# Patient Record
Sex: Female | Born: 1970 | Race: Black or African American | Hispanic: No | Marital: Single | Smoking: Former smoker
Health system: Southern US, Community
[De-identification: ages and names within clinical notes are randomized; demographics above are authoritative.]

## PROBLEM LIST (undated history)

## (undated) DIAGNOSIS — I10 Essential (primary) hypertension: Secondary | ICD-10-CM

## (undated) DIAGNOSIS — O139 Gestational [pregnancy-induced] hypertension without significant proteinuria, unspecified trimester: Secondary | ICD-10-CM

## (undated) DIAGNOSIS — D509 Iron deficiency anemia, unspecified: Secondary | ICD-10-CM

## (undated) HISTORY — DX: Iron deficiency anemia, unspecified: D50.9

## (undated) HISTORY — DX: Essential (primary) hypertension: I10

---

## 2000-03-23 ENCOUNTER — Encounter: Payer: Self-pay | Admitting: Emergency Medicine

## 2000-03-23 ENCOUNTER — Emergency Department (HOSPITAL_COMMUNITY): Admission: EM | Admit: 2000-03-23 | Discharge: 2000-03-23 | Payer: Self-pay | Admitting: Emergency Medicine

## 2004-04-05 ENCOUNTER — Emergency Department (HOSPITAL_COMMUNITY): Admission: EM | Admit: 2004-04-05 | Discharge: 2004-04-05 | Payer: Self-pay | Admitting: Emergency Medicine

## 2011-06-21 ENCOUNTER — Ambulatory Visit (INDEPENDENT_AMBULATORY_CARE_PROVIDER_SITE_OTHER): Payer: BC Managed Care – PPO | Admitting: Emergency Medicine

## 2011-06-21 VITALS — BP 173/69 | HR 106 | Temp 98.8°F | Resp 20 | Ht 65.0 in | Wt 172.0 lb

## 2011-06-21 DIAGNOSIS — K047 Periapical abscess without sinus: Secondary | ICD-10-CM

## 2011-06-21 DIAGNOSIS — K0889 Other specified disorders of teeth and supporting structures: Secondary | ICD-10-CM

## 2011-06-21 DIAGNOSIS — K089 Disorder of teeth and supporting structures, unspecified: Secondary | ICD-10-CM

## 2011-06-21 MED ORDER — AMOXICILLIN 500 MG PO CAPS: 500.0000 mg | ORAL_CAPSULE | Freq: Three times a day (TID) | ORAL | Status: AC

## 2011-06-21 MED ORDER — HYDROCODONE-ACETAMINOPHEN 5-325 MG PO TABS: 1.0000 | ORAL_TABLET | Freq: Four times a day (QID) | ORAL | Status: AC | PRN

## 2011-06-21 NOTE — Patient Instructions (Signed)
Please call your oral surgeon today and schedule appointment to have dental extractions.Dental Caries  Tooth decay (dental caries, cavities) is the most common of all oral diseases. It occurs in all ages but is more common in children and young adults.  CAUSES  Bacteria in your mouth combine with foods (particularly sugars and starches) to produce plaque. Plaque is a substance that sticks to the hard surfaces of teeth. The bacteria in the plaque produce acids that attack the enamel of teeth. Repeated acid attacks dissolve the enamel and create holes in the teeth. Root surfaces of teeth may also get these holes.  Other contributing factors include:   Frequent snacking and drinking of cavity-producing foods and liquids.   Poor oral hygiene.   Dry mouth.   Substance abuse such as methamphetamine.   Broken or poor fitting dental restorations.   Eating disorders.   Gastroesophageal reflux disease (GERD).   Certain radiation treatments to the head and neck.  SYMPTOMS  At first, dental decay appears as white, chalky areas on the enamel. In this early stage, symptoms are seldom present. As the decay progresses, pits and holes may appear on the enamel surfaces. Progression of the decay will lead to softening of the hard layers of the tooth. At this point you may experience some pain or achy feeling after sweet, hot, or cold foods or drinks are consumed. If left untreated, the decay will reach the internal structures of the tooth and produce severe pain. Extensive dental treatment, such as root canal therapy, may be needed to save the tooth at this late stage of decay development.  DIAGNOSIS  Most cavities will be detected during regular check-ups. A thorough medical and dental history will be taken by the dentist. The dentist will use instruments to check the surfaces of your teeth for any breakdown or discoloration. Some dentists have special instruments, such as lasers, that detect tooth decay.  Dental X-rays may also show some cavities that are not visible to the eye (such as between the contact areas of the teeth). TREATMENT  Treatment involves removal of the tooth decay and replacement with a restorative material such as silver, gold, or composite (white) material. However, if the decay involves a large area of the tooth and there is little remaining healthy tooth structure, a cap (crown) will be fitted over the remaining structure. If the decay involves the center part of the tooth (pulp), root canal treatment will be needed before any type of dental restoration is placed. If the tooth is severely destroyed by the decay process, leaving the remaining tooth structures unrestorable, the tooth will need to be pulled (extracted). Some early tooth decay may be reversed by fluoride treatments and thorough brushing and flossing at home. PREVENTION   Eat healthy foods. Restrict the amount of sugary, starchy foods and liquids you consume. Avoid frequent snacking and drinking of unhealthy foods and liquids.   Sealants can help with prevention of cavities. Sealants are composite resins applied onto the biting surfaces of teeth at risk for decay. They smooth out the pits and grooves and prevent food from being trapped in them. This is done in early childhood before tooth decay has started.   Fluoride tablets may also be prescribed to children between 6 months and 4 years of age if your drinking water is not fluoridated. The fluoride absorbed by the tooth enamel makes teeth less susceptible to decay. Thorough daily cleaning with a toothbrush and dental floss is the best way to prevent  cavities. Use of a fluoride toothpaste is highly recommended. Fluoride mouth rinses may be used in specific cases.   Topical application of fluoride by your dentist is important in children.   Regular visits with a dentist for checkups and cleanings are also important.  SEEK IMMEDIATE DENTAL CARE IF:  You have a  fever.   You develop redness and swelling of your face, jaw, or neck.   You develop swelling around a tooth.   You are unable to open your mouth or cannot swallow.   You have severe pain uncontrolled by pain medicine.  Document Released: 10/31/2001 Document Revised: 01/28/2011 Document Reviewed: 07/16/2010 [facility] Patient Information 2012 [place], [place].

## 2011-06-21 NOTE — Progress Notes (Signed)
  Subjective:    Patient ID: Sonya Taylor, female    DOB: Oct 20, 1970, 41 y.o.   MRN: [id number]  HPI patient presents with onset yesterday of swelling and tenderness in the right lower jaw. She has a history of dental care he is in that area and has had trouble before.    Review of Systems noncontributory except for heavy menstrual cramps with     Objective:   Physical Exam physical exam reveals the lower first and second premolars are broken off at the dump swelling around the dental caries        Assessment & Plan:  Patient here with significant dental caries dental abscess and gum swelling. We'll treat with amoxicillin pain medications and ibuprofen.

## 2012-02-20 ENCOUNTER — Encounter (HOSPITAL_BASED_OUTPATIENT_CLINIC_OR_DEPARTMENT_OTHER): Payer: Self-pay | Admitting: *Deleted

## 2012-02-20 ENCOUNTER — Emergency Department (HOSPITAL_BASED_OUTPATIENT_CLINIC_OR_DEPARTMENT_OTHER): Payer: BC Managed Care – PPO

## 2012-02-20 ENCOUNTER — Inpatient Hospital Stay (HOSPITAL_BASED_OUTPATIENT_CLINIC_OR_DEPARTMENT_OTHER)
Admission: EM | Admit: 2012-02-20 | Discharge: 2012-02-22 | DRG: 395 | Disposition: A | Discharge status: Home / Self Care | Payer: BC Managed Care – PPO | Attending: Internal Medicine | Admitting: Internal Medicine

## 2012-02-20 DIAGNOSIS — R609 Edema, unspecified: Secondary | ICD-10-CM

## 2012-02-20 DIAGNOSIS — R600 Localized edema: Secondary | ICD-10-CM

## 2012-02-20 DIAGNOSIS — E669 Obesity, unspecified: Secondary | ICD-10-CM | POA: Diagnosis present

## 2012-02-20 DIAGNOSIS — N924 Excessive bleeding in the premenopausal period: Secondary | ICD-10-CM

## 2012-02-20 DIAGNOSIS — D696 Thrombocytopenia, unspecified: Secondary | ICD-10-CM | POA: Diagnosis present

## 2012-02-20 DIAGNOSIS — F5083 Pica in adults: Secondary | ICD-10-CM | POA: Diagnosis present

## 2012-02-20 DIAGNOSIS — E876 Hypokalemia: Secondary | ICD-10-CM | POA: Diagnosis present

## 2012-02-20 DIAGNOSIS — D649 Anemia, unspecified: Secondary | ICD-10-CM

## 2012-02-20 DIAGNOSIS — F5089 Other specified eating disorder: Secondary | ICD-10-CM | POA: Diagnosis present

## 2012-02-20 DIAGNOSIS — D509 Iron deficiency anemia, unspecified: Secondary | ICD-10-CM | POA: Diagnosis present

## 2012-02-20 DIAGNOSIS — I509 Heart failure, unspecified: Secondary | ICD-10-CM | POA: Diagnosis present

## 2012-02-20 DIAGNOSIS — D500 Iron deficiency anemia secondary to blood loss (chronic): Principal | ICD-10-CM | POA: Diagnosis present

## 2012-02-20 DIAGNOSIS — R011 Cardiac murmur, unspecified: Secondary | ICD-10-CM | POA: Diagnosis present

## 2012-02-20 DIAGNOSIS — I10 Essential (primary) hypertension: Secondary | ICD-10-CM | POA: Diagnosis present

## 2012-02-20 DIAGNOSIS — Z6829 Body mass index (BMI) 29.0-29.9, adult: Secondary | ICD-10-CM

## 2012-02-20 DIAGNOSIS — N920 Excessive and frequent menstruation with regular cycle: Secondary | ICD-10-CM | POA: Diagnosis present

## 2012-02-20 HISTORY — DX: Gestational (pregnancy-induced) hypertension without significant proteinuria, unspecified trimester: O13.9

## 2012-02-20 LAB — BASIC METABOLIC PANEL
BUN: 6 mg/dL (ref 6–23)
CO2: 24 mEq/L (ref 19–32)
Chloride: 106 mEq/L (ref 96–112)
Creatinine, Ser: 0.6 mg/dL (ref 0.50–1.10)
GFR calc Af Amer: >90 mL/min (ref >90)
Glucose, Bld: 98 mg/dL (ref 70–99)
Potassium: 3.7 mEq/L (ref 3.5–5.1)

## 2012-02-20 LAB — URINALYSIS, ROUTINE W REFLEX MICROSCOPIC
Bilirubin Urine: NEGATIVE
Glucose, UA: NEGATIVE mg/dL
Hgb urine dipstick: NEGATIVE
Ketones, ur: NEGATIVE mg/dL
Nitrite: NEGATIVE
Protein, ur: NEGATIVE mg/dL
Specific Gravity, Urine: 1.013 (ref 1.005–1.030)
Urobilinogen, UA: 0.2 mg/dL (ref 0.0–1.0)
pH: 6 (ref 5.0–8.0)

## 2012-02-20 LAB — URINE MICROSCOPIC-ADD ON

## 2012-02-20 LAB — CBC
Hemoglobin: 3.5 g/dL — CL (ref 12.0–15.0)
MCH: 17.8 pg — ABNORMAL LOW (ref 26.0–34.0)
Platelets: 64 10*3/uL — ABNORMAL LOW (ref 150–400)
RBC: 1.97 MIL/uL — ABNORMAL LOW (ref 3.87–5.11)
WBC: 5.4 10*3/uL (ref 4.0–10.5)

## 2012-02-20 LAB — RETICULOCYTES: Retic Count, Absolute: 23.9 10*3/uL (ref 19.0–186.0)

## 2012-02-20 LAB — TROPONIN I: Troponin I: <0.3 ng/mL (ref <0.30)

## 2012-02-20 LAB — PREGNANCY, URINE: Preg Test, Ur: NEGATIVE

## 2012-02-20 MED ORDER — SODIUM CHLORIDE 0.9 % IV SOLN
Freq: Once | INTRAVENOUS | Status: AC
  Administered 2012-02-20: 21:00:00 via INTRAVENOUS

## 2012-02-20 MED ORDER — FUROSEMIDE 10 MG/ML IJ SOLN
40.0000 mg | Freq: Once | INTRAMUSCULAR | Status: AC
  Administered 2012-02-20: 40 mg via INTRAVENOUS
  Filled 2012-02-20: qty 4

## 2012-02-20 NOTE — ED Notes (Signed)
[facility] arrived. Report given to [facility].

## 2012-02-20 NOTE — ED Notes (Signed)
Pt states her legs have been swelling since before Christmas. No hx of same. States the swelling comes and goes.

## 2012-02-20 NOTE — ED Notes (Signed)
Attempt to call report to 2900. Secretary will have RN call for report.

## 2012-02-20 NOTE — ED Notes (Signed)
MD at bedside. 

## 2012-02-20 NOTE — ED Notes (Signed)
Pt. Transported to [facility]. Pt stable at d/c. No complaints. Sinus on tele.

## 2012-02-20 NOTE — ED Notes (Signed)
MD aware of low Hgb. Pt. Placed on monitor. Will monitor. Family aware that pt is being admitted to [facility].

## 2012-02-20 NOTE — ED Provider Notes (Signed)
History  This chart was scribed for Lyanne Co, MD by Ardeen Jourdain, ED Scribe. This patient was seen in room MH12/MH12 and the patient's care was started at 1931.  CSN: [id number]  Arrival date & time 02/20/12  1842   First MD Initiated Contact with Patient 02/20/12 1931      Chief Complaint  Patient presents with  . Leg Swelling     The history is provided by the patient. No language interpreter was used.    Sonya Taylor is a 41 y.o. female who presents to the Emergency Department complaining of gradually worsening leg swelling. She reports the swelling is aggravated by standing and walking. She states the swelling is relieved normally by rest and lying down. She denies problems with urination, CP and SOB as associated symptoms. 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 She denies any h/o HTN, DM and CHF. She denies any family history of    Past Medical History  Diagnosis Date  . PIH (pregnancy induced hypertension)     Past Surgical History  Procedure Date  . Cesarean section     History reviewed. No pertinent family history.  History  Substance Use Topics  . Smoking status: Current Every Day Smoker  . Smokeless tobacco: Not on file  . Alcohol Use: No   No OB history available.  Review of Systems  A complete 10 system review of systems was obtained and all systems are negative except as noted in the HPI and PMH.   Allergies  Review of patient's allergies indicates no known allergies.  Home Medications  No current outpatient prescriptions on file.  Triage Vitals: BP 161/90  Pulse 102  Temp 98.4 F (36.9 C) (Oral)  Resp 20  Ht 5' 6.5" (1.689 m)  Wt 180 lb (81.647 kg)  BMI 28.62 kg/m2  SpO2 100%  LMP 01/21/2012  Physical Exam  Nursing note and vitals reviewed. Constitutional: She is oriented to person, place, and time. She appears well-developed and well-nourished. No  distress.  HENT:  Head: Normocephalic and atraumatic.  Eyes: EOM are normal.  Neck: Normal range of motion.  Cardiovascular: Normal rate, regular rhythm and normal heart sounds.   Pulmonary/Chest: Effort normal and breath sounds normal. No respiratory distress. She has no wheezes.  Abdominal: Soft. She exhibits no distension. There is no tenderness.  Musculoskeletal: Normal range of motion. She exhibits edema.       2+ symmetrical pitting edema bilaterally   Neurological: She is alert and oriented to person, place, and time.  Skin: Skin is warm and dry.  Psychiatric: She has a normal mood and affect. Judgment normal.    ED Course  Procedures (including critical care time)  CRITICAL CARE Performed by: Lyanne Co Total critical care time: 30 Critical care time was exclusive of separately billable procedures and treating other patients. Critical care was necessary to treat or prevent imminent or life-threatening deterioration. Critical care was time spent personally by me on the following activities: development of treatment plan with patient and/or surrogate as well as nursing, discussions with consultants, evaluation of patient's response to treatment, examination of patient, obtaining history from patient or surrogate, ordering and performing treatments and interventions, ordering and review of laboratory studies, ordering and review of radiographic studies, pulse oximetry and re-evaluation of patient's condition.   DIAGNOSTIC STUDIES: Oxygen Saturation is 100% on room air, normal by my interpretation.  COORDINATION OF CARE:  7:42 PM: Discussed treatment plan which includes blood work, urinalysis and CXR with pt at bedside and pt agreed to plan.    Labs Reviewed  URINALYSIS, ROUTINE W REFLEX MICROSCOPIC - Abnormal; Notable for the following:    Leukocytes, UA MODERATE (*)     All other components within normal limits  PRO B NATRIURETIC PEPTIDE - Abnormal; Notable for the  following:    Pro B Natriuretic peptide (BNP) 1019.0 (*)     All other components within normal limits  D-DIMER, QUANTITATIVE - Abnormal; Notable for the following:    D-Dimer, Quant 0.75 (*)     All other components within normal limits  CBC - Abnormal; Notable for the following:    RBC 1.97 (*)     Hemoglobin 3.5 (*)     HCT 12.8 (*)     MCV 65.0 (*)     MCH 17.8 (*)     MCHC 27.3 (*)     RDW 31.6 (*)     Platelets 64 (*)  PLATELET COUNT CONFIRMED BY SMEAR   All other components within normal limits  PREGNANCY, URINE  BASIC METABOLIC PANEL  URINE MICROSCOPIC-ADD ON  TROPONIN I  VITAMIN B12  FOLATE  IRON AND TIBC  FERRITIN  RETICULOCYTES   Dg Chest 2 View  02/20/2012  *RADIOLOGY REPORT*  Clinical Data: Bilateral leg swelling.  CHEST - 2 VIEW  Comparison: None.  Findings: Trachea is midline.  Heart is mildly enlarged.  Mild diffuse interstitial prominence and indistinctness with peripheral septal lines at the lung bases. Probable trace bilateral pleural effusions with thickening of the fissures on the lateral view.  IMPRESSION: Mild congestive heart failure.   Original Report Authenticated By: Leanna Battles, M.D.    I personally reviewed the imaging tests through PACS system I reviewed available ER/hospitalization records through the EMR    1. CHF (congestive heart failure)   2. Anemia   3. Thrombocytopenia       MDM  The patient appears to be presenting with new onset congestive heart failure likely secondary to severe anemia.  Some clear with the etiology of her anemia is coming.  At this time I do not think she needs oh negative blood as her vital signs are stable and this is likely more bright acute or chronic issue.  She will need type and screen and blood transfusion issues she shows up to the [facility].  I discussed her care with the hospitalist who agrees to accept the patient in transfer.  The patient be admitted to the step down unit.  She did receive  40 mg of IV Lasix in emergency department.  Her EKG demonstrates nonspecific ST and T wave changes.  She has no active chest pain at this time.  She does not have heavy menstrual periods.  She denies melena or hematochezia.   Date: 02/20/2012  Rate: 89  Rhythm: normal sinus rhythm  QRS Axis: normal  Intervals: normal  ST/T Wave abnormalities: normal  Conduction Disutrbances: none  Narrative Interpretation:   Old EKG Reviewed: no prior ecg         I personally performed the services described in this documentation, which was scribed in my presence. The recorded information has been reviewed and is accurate.      Lyanne Co, MD 02/20/12 2232

## 2012-02-20 NOTE — Progress Notes (Signed)
Per Signout: 41 yo F with new onset CHF, labs remarkable for HGB of 3.5!! Tested twice at [facility], is a real value not dilutional.  Transferring to [facility] for admission and blood transfusion(s).  Also has low platelets.  Chronic bleed vs bone marrow, no acute obvious GI bleed.

## 2012-02-20 NOTE — ED Notes (Signed)
Transported to xray 

## 2012-02-20 NOTE — ED Notes (Signed)
Report to 2900.

## 2012-02-21 DIAGNOSIS — I509 Heart failure, unspecified: Secondary | ICD-10-CM | POA: Diagnosis present

## 2012-02-21 DIAGNOSIS — R030 Elevated blood-pressure reading, without diagnosis of hypertension: Secondary | ICD-10-CM

## 2012-02-21 DIAGNOSIS — E876 Hypokalemia: Secondary | ICD-10-CM | POA: Diagnosis present

## 2012-02-21 DIAGNOSIS — R011 Cardiac murmur, unspecified: Secondary | ICD-10-CM | POA: Diagnosis present

## 2012-02-21 DIAGNOSIS — I10 Essential (primary) hypertension: Secondary | ICD-10-CM | POA: Diagnosis present

## 2012-02-21 DIAGNOSIS — F5089 Other specified eating disorder: Secondary | ICD-10-CM | POA: Diagnosis present

## 2012-02-21 DIAGNOSIS — N924 Excessive bleeding in the premenopausal period: Secondary | ICD-10-CM | POA: Diagnosis present

## 2012-02-21 DIAGNOSIS — R609 Edema, unspecified: Secondary | ICD-10-CM

## 2012-02-21 DIAGNOSIS — D529 Folate deficiency anemia, unspecified: Secondary | ICD-10-CM

## 2012-02-21 DIAGNOSIS — R600 Localized edema: Secondary | ICD-10-CM | POA: Diagnosis present

## 2012-02-21 DIAGNOSIS — D509 Iron deficiency anemia, unspecified: Secondary | ICD-10-CM | POA: Diagnosis present

## 2012-02-21 DIAGNOSIS — D696 Thrombocytopenia, unspecified: Secondary | ICD-10-CM | POA: Diagnosis present

## 2012-02-21 LAB — COMPREHENSIVE METABOLIC PANEL
ALT: 12 U/L (ref 0–35)
AST: 18 U/L (ref 0–37)
Calcium: 9.1 mg/dL (ref 8.4–10.5)
Creatinine, Ser: 0.55 mg/dL (ref 0.50–1.10)
GFR calc Af Amer: >90 mL/min (ref >90)
GFR calc non Af Amer: >90 mL/min (ref >90)
Glucose, Bld: 101 mg/dL — ABNORMAL HIGH (ref 70–99)
Sodium: 142 mEq/L (ref 135–145)
Total Protein: 6.2 g/dL (ref 6.0–8.3)

## 2012-02-21 LAB — BASIC METABOLIC PANEL
BUN: 6 mg/dL (ref 6–23)
CO2: 25 mEq/L (ref 19–32)
Calcium: 8.5 mg/dL (ref 8.4–10.5)
Chloride: 104 mEq/L (ref 96–112)
Creatinine, Ser: 0.56 mg/dL (ref 0.50–1.10)
Creatinine, Ser: 0.53 mg/dL (ref 0.50–1.10)
GFR calc Af Amer: >90 mL/min (ref >90)
GFR calc non Af Amer: >90 mL/min (ref >90)
Glucose, Bld: 119 mg/dL — ABNORMAL HIGH (ref 70–99)
Glucose, Bld: 102 mg/dL — ABNORMAL HIGH (ref 70–99)
Sodium: 139 mEq/L (ref 135–145)

## 2012-02-21 LAB — CBC
HCT: 19 % — ABNORMAL LOW (ref 36.0–46.0)
Hemoglobin: 5.9 g/dL — CL (ref 12.0–15.0)
MCH: 17.3 pg — ABNORMAL LOW (ref 26.0–34.0)
MCHC: 31.1 g/dL (ref 30.0–36.0)
MCV: 64.4 fL — ABNORMAL LOW (ref 78.0–100.0)
Platelets: 51 10*3/uL — ABNORMAL LOW (ref 150–400)
RBC: 2.7 MIL/uL — ABNORMAL LOW (ref 3.87–5.11)
RDW: 31.6 % — ABNORMAL HIGH (ref 11.5–15.5)
WBC: 4.7 10*3/uL (ref 4.0–10.5)

## 2012-02-21 LAB — MRSA PCR SCREENING: MRSA by PCR: NEGATIVE

## 2012-02-21 LAB — IRON AND TIBC
Iron: 30 ug/dL — ABNORMAL LOW (ref 42–135)
TIBC: 559 ug/dL — ABNORMAL HIGH (ref 250–470)

## 2012-02-21 LAB — HEPATIC FUNCTION PANEL
Albumin: 3.2 g/dL — ABNORMAL LOW (ref 3.5–5.2)
Alkaline Phosphatase: 95 U/L (ref 39–117)
Indirect Bilirubin: 0.3 mg/dL (ref 0.3–0.9)
Total Protein: 6.2 g/dL (ref 6.0–8.3)

## 2012-02-21 LAB — LACTATE DEHYDROGENASE: LDH: 178 U/L (ref 94–250)

## 2012-02-21 LAB — DIRECT ANTIGLOBULIN TEST (NOT AT ARMC): DAT, IgG: NEGATIVE

## 2012-02-21 LAB — FERRITIN: Ferritin: 9 ng/mL — ABNORMAL LOW (ref 10–291)

## 2012-02-21 LAB — PREPARE RBC (CROSSMATCH)

## 2012-02-21 LAB — VITAMIN B12: Vitamin B-12: 686 pg/mL (ref 211–911)

## 2012-02-21 LAB — ABO/RH: ABO/RH(D): A POS

## 2012-02-21 MED ORDER — OXYCODONE HCL 5 MG PO TABS: 5.0000 mg | ORAL_TABLET | ORAL | Status: DC | PRN

## 2012-02-21 MED ORDER — SODIUM CHLORIDE 0.9 % IV SOLN: 250.0000 mL | INTRAVENOUS | Status: DC | PRN

## 2012-02-21 MED ORDER — ONDANSETRON HCL 4 MG/2ML IJ SOLN: 4.0000 mg | Freq: Four times a day (QID) | INTRAMUSCULAR | Status: DC | PRN

## 2012-02-21 MED ORDER — FUROSEMIDE 10 MG/ML IJ SOLN
40.0000 mg | Freq: Two times a day (BID) | INTRAMUSCULAR | Status: DC
  Filled 2012-02-21: qty 4

## 2012-02-21 MED ORDER — SODIUM CHLORIDE 0.9 % IJ SOLN
3.0000 mL | Freq: Two times a day (BID) | INTRAMUSCULAR | Status: DC
  Administered 2012-02-21: 3 mL via INTRAVENOUS

## 2012-02-21 MED ORDER — HYDROCHLOROTHIAZIDE 12.5 MG PO CAPS
12.5000 mg | ORAL_CAPSULE | Freq: Every day | ORAL | Status: DC
  Administered 2012-02-21 – 2012-02-22 (×2): 12.5 mg via ORAL
  Filled 2012-02-21 (×2): qty 1

## 2012-02-21 MED ORDER — CARVEDILOL 3.125 MG PO TABS
3.1250 mg | ORAL_TABLET | Freq: Two times a day (BID) | ORAL | Status: DC
  Filled 2012-02-21 (×3): qty 1

## 2012-02-21 MED ORDER — ONDANSETRON HCL 4 MG PO TABS: 4.0000 mg | ORAL_TABLET | Freq: Four times a day (QID) | ORAL | Status: DC | PRN

## 2012-02-21 MED ORDER — ACETAMINOPHEN 325 MG PO TABS: 650.0000 mg | ORAL_TABLET | ORAL | Status: DC | PRN

## 2012-02-21 MED ORDER — SODIUM CHLORIDE 0.9 % IV SOLN
25.0000 mg | Freq: Once | INTRAVENOUS | Status: AC
  Administered 2012-02-21: 25 mg via INTRAVENOUS
  Filled 2012-02-21: qty 0.5

## 2012-02-21 MED ORDER — DIPHENHYDRAMINE HCL 50 MG/ML IJ SOLN: 25.0000 mg | Freq: Once | INTRAMUSCULAR | Status: DC

## 2012-02-21 MED ORDER — SODIUM CHLORIDE 0.9 % IJ SOLN: 3.0000 mL | INTRAMUSCULAR | Status: DC | PRN

## 2012-02-21 MED ORDER — CARVEDILOL 6.25 MG PO TABS
6.2500 mg | ORAL_TABLET | Freq: Two times a day (BID) | ORAL | Status: DC
  Administered 2012-02-21 – 2012-02-22 (×3): 6.25 mg via ORAL
  Filled 2012-02-21 (×4): qty 1

## 2012-02-21 MED ORDER — POTASSIUM CHLORIDE CRYS ER 20 MEQ PO TBCR: 20.0000 meq | EXTENDED_RELEASE_TABLET | Freq: Two times a day (BID) | ORAL | Status: DC

## 2012-02-21 MED ORDER — SODIUM CHLORIDE 0.9 % IV SOLN
2000.0000 mg | Freq: Once | INTRAVENOUS | Status: DC
  Filled 2012-02-21: qty 40

## 2012-02-21 MED ORDER — FUROSEMIDE 10 MG/ML IJ SOLN
20.0000 mg | Freq: Once | INTRAMUSCULAR | Status: AC
  Administered 2012-02-21: 20 mg via INTRAVENOUS

## 2012-02-21 MED ORDER — FOLIC ACID 1 MG PO TABS
2.0000 mg | ORAL_TABLET | Freq: Every day | ORAL | Status: DC
  Administered 2012-02-21 – 2012-02-22 (×2): 2 mg via ORAL
  Filled 2012-02-21 (×2): qty 2

## 2012-02-21 MED ORDER — ZOLPIDEM TARTRATE 5 MG PO TABS: 5.0000 mg | ORAL_TABLET | Freq: Every evening | ORAL | Status: DC | PRN

## 2012-02-21 MED ORDER — LISINOPRIL 2.5 MG PO TABS
2.5000 mg | ORAL_TABLET | Freq: Every day | ORAL | Status: DC
  Filled 2012-02-21: qty 1

## 2012-02-21 MED ORDER — ALUM & MAG HYDROXIDE-SIMETH 200-200-20 MG/5ML PO SUSP: 30.0000 mL | Freq: Four times a day (QID) | ORAL | Status: DC | PRN

## 2012-02-21 MED ORDER — ACETAMINOPHEN 325 MG PO TABS
650.0000 mg | ORAL_TABLET | Freq: Four times a day (QID) | ORAL | Status: DC | PRN
  Administered 2012-02-22 (×3): 650 mg via ORAL
  Filled 2012-02-21: qty 1
  Filled 2012-02-21 (×5): qty 2

## 2012-02-21 MED ORDER — ACETAMINOPHEN 325 MG PO TABS
325.0000 mg | ORAL_TABLET | ORAL | Status: AC
  Administered 2012-02-21 (×3): 325 mg via ORAL
  Filled 2012-02-21: qty 1
  Filled 2012-02-21: qty 2

## 2012-02-21 MED ORDER — DIPHENHYDRAMINE HCL 25 MG PO CAPS
25.0000 mg | ORAL_CAPSULE | ORAL | Status: AC
  Administered 2012-02-21 (×3): 25 mg via ORAL
  Filled 2012-02-21 (×3): qty 1

## 2012-02-21 MED ORDER — HYDRALAZINE HCL 20 MG/ML IJ SOLN
10.0000 mg | Freq: Four times a day (QID) | INTRAMUSCULAR | Status: DC | PRN
  Filled 2012-02-21: qty 1

## 2012-02-21 MED ORDER — POTASSIUM CHLORIDE CRYS ER 20 MEQ PO TBCR
40.0000 meq | EXTENDED_RELEASE_TABLET | Freq: Three times a day (TID) | ORAL | Status: AC
  Administered 2012-02-21 (×3): 40 meq via ORAL
  Filled 2012-02-21 (×3): qty 2

## 2012-02-21 MED ORDER — SODIUM CHLORIDE 0.9 % IV SOLN
25.0000 mg | Freq: Once | INTRAVENOUS | Status: DC
  Filled 2012-02-21: qty 0.5

## 2012-02-21 MED ORDER — HYDRALAZINE HCL 10 MG PO TABS
10.0000 mg | ORAL_TABLET | Freq: Four times a day (QID) | ORAL | Status: DC
  Administered 2012-02-21 – 2012-02-22 (×2): 10 mg via ORAL
  Filled 2012-02-21 (×7): qty 1

## 2012-02-21 MED ORDER — SODIUM CHLORIDE 0.9 % IV SOLN: 2000.0000 mg | Freq: Once | INTRAVENOUS | Status: DC

## 2012-02-21 MED ORDER — ACETAMINOPHEN 650 MG RE SUPP: 650.0000 mg | Freq: Four times a day (QID) | RECTAL | Status: DC | PRN

## 2012-02-21 MED ORDER — HYDROMORPHONE HCL PF 1 MG/ML IJ SOLN: 0.5000 mg | INTRAMUSCULAR | Status: DC | PRN

## 2012-02-21 NOTE — Progress Notes (Signed)
Dr. Lovell Sheehan currently in room with pt.

## 2012-02-21 NOTE — H&P (Signed)
[facility] History and Physical  SHERRITA RIEDERER WUJ:[id number] DOB: November 03, 1970 DOA: 02/20/2012  Referring physician: EDP PCP: Tally Due, MD  Specialists:   Chief Complaint: Increased Swelling in Both legs   HPI: Sonya Taylor is a 41 y.o. female who presented to the [facility] ED with complaints of Increased Swelling in both legs noticed upon awakening.  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  This time when she awakened the swelling was worse than before she went to sleep.    She denies having any Chest Pain, SOB or Orthopnea, or dyspnea on exertion.   She reports having hypertension when she was pregnant, and she reports she had a heart murmur when she was a child, and she states that she was told that it went away.      In the ED, she was [place]NP [place] 9[address], and a hemoglobin level of 3.5.   She denies having any hematemesis, melena, or hematochezia.  She does report having menorrhagia and a menses that is regular and lasts 7 days.   She also report that she chronically eats and craves ice.   She was also found to have a loud III/VI SEM on examination as well as 3+BLE Edema.      Review of Systems: The patient denies anorexia, fever, weight loss, vision loss, decreased hearing, hoarseness, chest pain, syncope, dyspnea on exertion, balance deficits, hemoptysis, abdominal pain, nausea, vomiting, diarrhea, hematemesis, melena, hematochezia, severe indigestion/heartburn, hematuria, dysuria, incontinence, genital sores, muscle weakness, suspicious skin lesions, transient blindness, difficulty walking, depression, unusual weight change, abnormal bleeding, enlarged lymph nodes, angioedema, and breast masses.    Past Medical History  Diagnosis Date  . PIH (pregnancy induced hypertension)    Past  Surgical History  Procedure Date  . Cesarean section      Medications:  HOME MEDS: Prior to Admission medications   Not on File    Allergies:  No Known Allergies  Social History:   reports that she has been smoking.  She does not have any smokeless tobacco history on file. She reports that she does not drink alcohol or use illicit drugs.  Family History: .  Hypertension in Maternal Aunt    Diabetes in Daughter   Physical Exam:  GEN:  Pleasant 41 year old Obese African American Female examined  and in no acute distress; cooperative with exam Filed Vitals:   02/21/12 0015 02/21/12 0030 02/21/12 0045 02/21/12 0100  BP: 161/79 160/79 141/77 137/83  Pulse:      Temp:      TempSrc:      Resp: 22 15 14 15   Height:      Weight:      SpO2:  98%     Blood pressure 137/83, pulse 90, temperature 99 F (37.2 C), temperature source Oral, resp. rate 15, height 5' 6.5" (1.689 m), weight 81.6 kg (179 lb 14.3 oz), last menstrual period 01/21/2012, SpO2 98.00%. PSYCH: She is alert and oriented x4; does not appear anxious does not appear depressed; affect is normal HEENT: Normocephalic and Atraumatic, Mucous membranes pink; PERRLA; EOM intact; Fundi:  Benign;  No scleral icterus, Nares: Patent, Oropharynx: Clear, Poor Dentition, Neck:  FROM, no cervical lymphadenopathy nor thyromegaly or carotid bruit; no JVD; Beasts:: Not examined CHEST WALL: No tenderness CHEST: Normal respiration,  clear to auscultation bilaterally HEART: Regular rate and rhythm; no murmurs rubs or gallops BACK: No kyphosis or scoliosis; no CVA tenderness ABDOMEN: Positive Bowel Sounds, Obese, soft non-tender; no masses, no organomegaly, no pannus; no intertriginous candida. Rectal Exam: Not done EXTREMITIES: No bone or joint deformity; age-appropriate arthropathy of the hands and knees; no cyanosis, clubbing or ulcerations.  3+ BLE EDEMA.   Genitalia: not examined PULSES: 2+ and symmetric SKIN: Normal hydration no  rash or ulceration CNS: Cranial nerves 2-12 grossly intact no focal neurologic deficit    Labs on Admission:  Basic Metabolic Panel:  Lab 02/20/12 1610  NA 140  K 3.7  CL 106  CO2 24  GLUCOSE 98  BUN 6  CREATININE 0.60  CALCIUM 8.6  MG --  PHOS --   Liver Function Tests: No results found for this basename: AST:5,ALT:5,ALKPHOS:5,BILITOT:5,PROT:5,ALBUMIN:5 in the last 168 hours No results found for this basename: LIPASE:5,AMYLASE:5 in the last 168 hours No results found for this basename: AMMONIA:5 in the last 168 hours CBC:  Lab 02/20/12 2020  WBC 5.4  NEUTROABS --  HGB 3.5*  HCT 12.8*  MCV 65.0*  PLT 64*   Cardiac Enzymes:  Lab 02/20/12 2000  CKTOTAL --  CKMB --  CKMBINDEX --  TROPONINI <0.30    BNP (last 3 results)  Basename 02/20/12 2000  PROBNP 1019.0*   CBG: No results found for this basename: GLUCAP:5 in the last 168 hours  Radiological Exams on Admission: Dg Chest 2 View  02/20/2012  *RADIOLOGY REPORT*  Clinical Data: Bilateral leg swelling.  CHEST - 2 VIEW  Comparison: None.  Findings: Trachea is midline.  Heart is mildly enlarged.  Mild diffuse interstitial prominence and indistinctness with peripheral septal lines at the lung bases. Probable trace bilateral pleural effusions with thickening of the fissures on the lateral view.  IMPRESSION: Mild congestive heart failure.   Original Report Authenticated By: Leanna Battles, M.D.     EKG: Independently reviewed. Normal Sinus Rhythm No Acute ST changes, LVH changes seen Assessment: Principal Problem:  *Anemia Active Problems:  CHF (congestive heart failure)  Thrombocytopenia  Edema  Heart murmur, systolic    Plan:     Admit to Stepdown Unit Transfuse 4 units PRBCs Diurese between Units Begin CHF Protocol , 2 D ECHO ordered  Monitor Electrolytes and Platelets ( re-check Plts to verify) Venous Duplex US ordered due increased D-dimer, BLE EDEMA, and Thrombocytopenia Check Albumin level due  to EDEMA SCDs for DVT prophylaxis     Code Status:  FULL CODE Family Communication: N/A Disposition Plan:  Return to Home  Time spent: 96 Minutes  Ron Parker [facility] Pager [phone]  If 7PM-7AM, please contact night-coverage [email or web address] Password [email or web address] 02/21/2012, 3:29 AM

## 2012-02-21 NOTE — Progress Notes (Signed)
[facility] Progress Note [place] TEAM 1 - Stepdown/ICU TEAM   Sonya Taylor YNW:[id number] DOB: [birth year] DOA: 02/20/2012 PCP: Tally Due, MD  Brief narrative: 42 year old female patient with no significant medical history other than pregnancy-induced hypertension. She presented to the hospital with significant lower sternum the edema and not associated with shortness of breath or chest pain. In the emergency department her BNP was elevated at 17,000. Her chest x-ray demonstrated mild congestive heart failure changes. Her white count was normal. Her hemoglobin was quite low at 3.5 with hematocrit of 13. She did endorse cravings of ice and shellfish. Because of the low hemoglobin a total of 4 units of packed red blood cells had artery been ordered and initiated by the emergency department. No apparent GI source for the anemia. Patient states she does have heavy periods but not excessively heavy and usually goes to about 5-6 pads per episode of menses and not every episode of menses is heavy. She was subsequently admitted to the step down unit.  Assessment/Plan:  Iron deficiency anemia, unspecified *Patient is tolerating the anemia quite well without any headache, dizziness or shortness of breath or chest pain. This correlates that this anemia is quite chronic *Patient's menses alone do not explain her anemia *Her iron level is quite low but her reticulocyte count is also low and given her associated pancytopenia there is concern for possible bone marrow suppression *Requested formal hematological consultation  Thrombocytopenia *Unclear if secondary to chronic consumption from recurrent bleeding and chronic anemia or if this is more reflective of bone marrow dysfunction-see above  Bilateral lower extremity edema *Likely related to third spacing from low hemoglobin therefore low serum protein as well as possible increased cardiac demand from persistent anemia *No evidence  of DVT Dopplers  HTN, goal below 140/90 *Did not carry a formal diagnosis of chronic hypertension before admission but did have pregnancy-induced hypertension *ACE inhibitor as well as carvedilol initiated this admission the patient does have mild renal insufficiency so we'll discontinue ACE inhibitor at this time in setting of severe volume depletion/anemia  Hypokalemia *Oral replete  CHF with unknown LVEF (mild) *Patient had mild heart failure on x-ray and elevated BNP-echocardiogram already completed before our rounds this morning and results are pending *Never had any respiratory symptoms and is not hypoxic so we'll discontinue Lasix - likely all due to profound anemia  Heart murmur, systolic *Followup on echo but this is likely hyperdynamic in nature related to severity of anemia  Menorrhagia, premenopausal *Will consider an intravaginal ultrasound to determine if patient has enlarged endometrial stripe and or fibroids in the event she needs surgical treatment for her menses  Pica *Appears to be related to low iron levels   DVT prophylaxis: SCDs Code Status: Full Family Communication: Spoke with patient Disposition Plan: Transfer to telemetry  Consultants: Hematology  Procedures: None  Antibiotics: None  HPI/Subjective: Patient alert and currently denies any dizziness, headache or chest pain. States is hungry and would like to eat.   Objective: Blood pressure 159/90, pulse 86, temperature 98.6 F (37 C), temperature source Oral, resp. rate 20, height 5' 6.5" (1.689 m), weight 83.2 kg (183 lb 6.8 oz), last menstrual period 01/21/2012, SpO2 97.00%.  Intake/Output Summary (Last 24 hours) at 02/21/12 1251 Last data filed at 02/21/12 1238  Gross per 24 hour  Intake   1455 ml  Output      0 ml  Net   1455 ml     Exam: F/U exam completed  Data Reviewed: Basic Metabolic Panel:  Lab 02/21/12 1308 02/20/12 2000  NA 139 140  K 2.8* 3.7  CL 103 106  CO2 25 24    GLUCOSE 119* 98  BUN 4* 6  CREATININE 0.56 0.60  CALCIUM 8.5 8.6  MG -- --  PHOS -- --   Liver Function Tests: No results found for this basename: AST:5,ALT:5,ALKPHOS:5,BILITOT:5,PROT:5,ALBUMIN:5 in the last 168 hours No results found for this basename: LIPASE:5,AMYLASE:5 in the last 168 hours No results found for this basename: AMMONIA:5 in the last 168 hours CBC:  Lab 02/21/12 0323 02/20/12 2020  WBC 4.7 5.4  NEUTROABS -- --  HGB 3.5* 3.5*  HCT 13.0* 12.8*  MCV 64.4* 65.0*  PLT 51* 64*   Cardiac Enzymes:  Lab 02/20/12 2000  CKTOTAL --  CKMB --  CKMBINDEX --  TROPONINI <0.30   BNP (last 3 results)  Basename 02/20/12 2000  PROBNP 1019.0*   CBG: No results found for this basename: GLUCAP:5 in the last 168 hours  Recent Results (from the past 240 hour(s))  MRSA PCR SCREENING     Status: Normal   Collection Time   02/21/12 12:09 AM      Component Value Range Status Comment   MRSA by PCR NEGATIVE  NEGATIVE Final      Studies:  Recent x-ray studies have been reviewed in detail by the Attending Physician  Scheduled Meds:  Reviewed in detail by the Attending Physician   Junious Silk, ANP [facility] Office  [phone] Pager [phone]  On-Call/Text Page:      [email or web address]      password [email or web address]  If 7PM-7AM, please contact night-coverage [email or web address] Password [email or web address] 02/21/2012, 12:51 PM   LOS: 1 day   I have personally examined this patient and reviewed the entire database. I have reviewed the above note, made any necessary editorial changes, and agree with its content.  Lonia Blood, MD [facility]

## 2012-02-21 NOTE — Progress Notes (Signed)
Bilateral:  No evidence of DVT, superficial thrombosis, or Baker's Cyst.   

## 2012-02-21 NOTE — Progress Notes (Signed)
Pt leaves floor at this time via w.c.  3rd unit of PRBCs has completed and pt tolerated well.  Sent empty blood bag with pt to next unit just in case reaction were to occur.  No s/s of any acute distress at transfer.

## 2012-02-21 NOTE — Progress Notes (Signed)
  Echocardiogram 2D Echocardiogram has been performed.  Sonya Taylor 02/21/2012, 8:54 AM

## 2012-02-21 NOTE — Consult Note (Addendum)
[facility]  [phone]) [phone]   HEMATOLOGY ONCOLOGY CONSULTATION   Sonya Taylor  DOB: 21-Mar-1970  MR#: [id number]  CSN#: [id number]    Requesting Physician: [facility]     History of present illness:         41 year old AAF  female  smoker admitted from [facility]  with increased lower extremity edema, without any other cardiac complaints. She was found to have a  BNP of 17,000, diagnosed with CFH. H/H was 3.5/13 respectively, MCV 64.4. WBC normal at 4.7.Never had a transfusion in the past.Currently  Receiving the second of 4 units of blood. Denies risk factors for HIV or hepatitis. No hemoptysis or epistaxis. No blood in urine or in stool.Smear has been ordered for review. Retic Count (absolute) is  23.9, percent 1.2 , LDH and  Haptoglobin pending . Fe levels were 30  , TIBC 559  , percent saturation 5 ,Ferritin 9 , B12 686 , Folate 4.1.  No SPEP/UPEP available . Her platelets are 51,000. D-Dimer was 0.75. Neg pregnancy test. UA  positive for moderate leukocytes, otherwise negative.CXR was consistent with mild CHF.  LE dopplers negative for DVT or SVT. She is Full Code. No family history of hematological disorders. Patient had never been evaluated for anemia by a hematologist. She had never been on oral iron. Never received IV Iron. Never had a bone marrow biopsy. Never had a  colonoscopy / EGD . Craves Ice chips for many years, No heavy  coffee, starch, or iced tea intake.  She also craves seafood, specifically crabs and sea bass and tuna. She states that she is exposed to many chemicals at work.  She reports heavy regular periods, lasting about  7 days, with clot formation. Marland Kitchen LMP11/29/2013.   We were kindly requested to see the patient with recommendations.    Past medical history:      Past Medical History  Diagnosis Date  . PIH (pregnancy induced hypertension)     Past surgical history:      Past Surgical History  Procedure Date  . Cesarean section      Medications:   Prior to Admission:  No prescriptions prior to admission       . carvedilol  6.25 mg Oral BID WC  . potassium chloride  40 mEq Oral TID    YQM:VHQIONGEXBMWU, acetaminophen, alum & mag hydroxide-simeth, HYDROmorphone (DILAUDID) injection, ondansetron (ZOFRAN) IV, ondansetron, oxyCODONE, zolpidem  Allergies: No Known Allergies  Family history: Negative for Sickle Cell or Thanlassemia. No bleeding disorders in family. No Colon Cancer in family.                            Social history:   Single , 2 Children. Lives in [place] . Smokes1/2  ppd for 20 years, Denies ETOH. No recreational drug use. 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     Review of systems:  See HPI for significant positives.  Rest of the ROS is negative.  Physical exam:       Filed Vitals:   02/21/12 0945  BP: 163/87  Pulse:   Temp: 98.3 F (36.8 C)  Resp: 19    Weight change:   General:  9 -year-old AAF  in no acute distress A. and O. x3  well-developed  HEENT: Normocephalic, atraumatic, PERRLA, sclerae anicteric. Oral cavity without thrush or lesions.Poor dentition Neck supple. no thyromegaly, no  cervical or supraclavicular adenopathy  Lungs clear bilaterally . No wheezing, rhonchi or rales. No axillary masses. Breasts: not examined. Cardiac regular rate and rhythm, 2-3/6 systolic  murmur , rubs or gallops Abdomen obese,  soft nontender , bowel sounds x4. No HSM. No masses palpable.  GU/rectal: deferred. Extremities no clubbing, no  cyanosis , 3 + edema. No bruising or petechial rash Musculoskeletal: no spinal tenderness.  Neuro: Non Focal   Lab results:      Lab 02/21/12 0323 02/20/12 2020  WBC 4.7 5.4  HGB 3.5* 3.5*  HCT 13.0* 12.8*  PLT 51* 64*  MCV 64.4* 65.0*  MCH 17.3* 17.8*  MCHC 26.9* 27.3*  RDW 31.6* 31.6*  LYMPHSABS -- --  MONOABS -- --  EOSABS -- --  BASOSABS -- --  BANDABS -- --    Chemistries   Lab 02/21/12  0323 02/20/12 2000  NA 139 140  K 2.8* 3.7  CL 103 106  CO2 25 24  GLUCOSE 119* 98  BUN 4* 6  CREATININE 0.56 0.60  CALCIUM 8.5 8.6  MG -- --    Anemia panel:   Basename 02/20/12 2113  VITAMINB12 686  FOLATE 4.1  FERRITIN 9*  TIBC 559*  IRON 30*  RETICCTPCT 1.2     Studies:      Dg Chest 2 View  02/20/2012  *RADIOLOGY REPORT*  Clinical Data: Bilateral leg swelling.  CHEST - 2 VIEW  Comparison: None.  Findings: Trachea is midline.  Heart is mildly enlarged.  Mild diffuse interstitial prominence and indistinctness with peripheral septal lines at the lung bases. Probable trace bilateral pleural effusions with thickening of the fissures on the lateral view.  IMPRESSION: Mild congestive heart failure.   Original Report Authenticated By: Leanna Battles, M.D.     Assessmnent/Plan:41 y.o. female admitted on with CHF exacerbation, found to have severe anemia without obvious bleeding. She was noted to have Iron deficiency, as well as thrombocytopenia, likely worsened counts in the setting of CHF.  LDH And Haptoglobin are pending. Will need to check  DAT and ANA. Smear prior to transfusion has been ordered to rule out abnormalities . We were requested to see the patient with recommendations.  Dr.  Myna Hidalgo  is to see the patient following this consult with recommendations regarding diagnosis, treatment options and further workup studies.Likely, she wil need IV Fe while hospitalized   An addendum to this note is to be written. Thank you for the referral.   [facility] E 02/21/2012   ADDENDUM:   The patient was seen and examined. I agree with Sara's assessment as stated above.  This is a very interesting case!!! She is clearly iron deficient by her lab work and him by her blood smear. She has a very, low reticulocyte count. She is Coombs negative. She has a normal haptoglobin.  Again her blood smear shows incredibly hypochromic and microcytic cells. I do not see target cells. There  were no nucleated red cells. She had a rare schistocytes. There were no spherocytes.  There is no history of sickle cell in the family.  She denies any bleeding elsewhere. She said she's always been chewing ice. She's had a cesarean sections. She has normal cycles.  She actually had been working. She went to the [facility] and was found to have congestive heart failure.  I believe that the leg edema is related to her anemia and not so much heart failure.  What I do find interesting is her low platelet  count. Typically with iron deficiency, platelet count are high. As such, I wondered if she has a second hematologic issue going on. 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 By her blood smear, and she had a couple large platelets but most of the platelets were small.  I noted that her folate was on the lower side. I probably put her on folic acid supplementation.  On my exam, her vital signs are stable. He does have a elevated blood pressure. She did not come in on any blood pressure medications. As such, I wonder if some hypertension may not be a little bit of an issue with her thrombocytopenia. Her kidney function is okay.  Her lungs are clear bilaterally. Cardiac exam is regular rate and rhythm with a 1/6 systolic ejection murmur. Abdominal exam is soft. She's good bowel sounds. I cannot palpate her liver or spleen. Extremities shows 1+ edema in her legs. Skin exam shows no ecchymoses or petechia. Neurological exam no focal neurological deficits. Oral exam shows no mucositis. There is no glossitis. On her ocular exam, there is no scleral icterus. She has pale conjunctiva.  I think for now, we'll need to do is give her IV iron. In her case, I would favor iron dextran as I can give her a higher dose of this that should be able to replace her iron stores. I talked to her about this. We will premedicate her for the test dose tomorrow. I told her about the 2% chance of an  allergic reaction.  I also would get her on folic acid. I put her on 2 mg a day.  Is going to take a good 3-4 weeks before her hemoglobin starts trending upward with her iron replacement. I think we just need to be patient.  I also told her to take vitamin C supplementation. I told her to take 500 mg daily. This will help with oral iron absorption.    Hopefully she'll be discharged in a day or so. She really wants go back to work. I suppose this would be okay and she honestly has been working with significant anemia.  I'll plan to see her in my office as an outpatient. She actually works down the street from my office.  She is very nice. We had an excellent prayer session. She is a woman strong faith.   Pete E.  Hebrews 12:12  ADDENDUM #2:  I probably would avoid Hydralazine given the potentially adverse effects on the bone marrow.  I would prefer that a different anti-HTN be used.  Thanks!!!!

## 2012-02-21 NOTE — Progress Notes (Signed)
Informed answering service staff on [phone] of pt's arrival to floor. Staff stated will page admitting MD.

## 2012-02-21 NOTE — Care Management Note (Signed)
    Page 1 of 1   02/21/2012     10:44:02 AM   CARE MANAGEMENT NOTE 02/21/2012  Patient:  Sonya Taylor, Sonya Taylor   Account Number:  [id number]  Date Initiated:  02/21/2012  Documentation initiated by:  Junius Creamer  Subjective/Objective Assessment:   adm w heart failure     Action/Plan:   lives alone, pcp dr Thayer Ohm guest   Anticipated DC Date:     Anticipated DC Plan:        DC Planning Services  CM consult      Choice offered to / List presented to:             Status of service:   Medicare Important Message given?   (If response is "NO", the following Medicare IM given date fields will be blank) Date Medicare IM given:   Date Additional Medicare IM given:    Discharge Disposition:    Per UR Regulation:  Reviewed for med. necessity/level of care/duration of stay  If discussed at Long Length of Stay Meetings, dates discussed:    Comments:  12/30 1043 debbie Edrik Rundle rn,bsn [phone]

## 2012-02-21 NOTE — Progress Notes (Signed)
Paged admitting MD again via Amion

## 2012-02-21 NOTE — Progress Notes (Signed)
Report given to unit 3000 receiving nurse at this time.  Pt getting 3rd unit out of four and tolerating well.  No c/o pain.  No sob.  No s/s of any acute distress noted.

## 2012-02-22 DIAGNOSIS — D509 Iron deficiency anemia, unspecified: Secondary | ICD-10-CM

## 2012-02-22 DIAGNOSIS — N924 Excessive bleeding in the premenopausal period: Secondary | ICD-10-CM

## 2012-02-22 LAB — BASIC METABOLIC PANEL
CO2: 26 mEq/L (ref 19–32)
Calcium: 9.3 mg/dL (ref 8.4–10.5)
Chloride: 105 mEq/L (ref 96–112)
Glucose, Bld: 108 mg/dL — ABNORMAL HIGH (ref 70–99)
Potassium: 4.3 mEq/L (ref 3.5–5.1)
Sodium: 138 mEq/L (ref 135–145)

## 2012-02-22 LAB — TYPE AND SCREEN
Antibody Screen: NEGATIVE
Unit division: 0
Unit division: 0
Unit division: 0

## 2012-02-22 LAB — CBC
HCT: 24.8 % — ABNORMAL LOW (ref 36.0–46.0)
Hemoglobin: 7.9 g/dL — ABNORMAL LOW (ref 12.0–15.0)
MCH: 23.7 pg — ABNORMAL LOW (ref 26.0–34.0)
MCV: 74.5 fL — ABNORMAL LOW (ref 78.0–100.0)
RBC: 3.33 MIL/uL — ABNORMAL LOW (ref 3.87–5.11)
WBC: 7.3 10*3/uL (ref 4.0–10.5)

## 2012-02-22 MED ORDER — HYDROCHLOROTHIAZIDE 12.5 MG PO CAPS: 12.5000 mg | ORAL_CAPSULE | Freq: Every day | ORAL | Status: DC

## 2012-02-22 MED ORDER — FOLIC ACID 1 MG PO TABS: 2.0000 mg | ORAL_TABLET | Freq: Every day | ORAL | Status: DC

## 2012-02-22 MED ORDER — VITAMIN C 500 MG PO TABS: 500.0000 mg | ORAL_TABLET | Freq: Every day | ORAL | Status: AC

## 2012-02-22 MED ORDER — SODIUM CHLORIDE 0.9 % IV SOLN
2000.0000 mg | INTRAVENOUS | Status: AC
  Administered 2012-02-22: 2000 mg via INTRAVENOUS
  Filled 2012-02-22 (×2): qty 40

## 2012-02-22 MED ORDER — CARVEDILOL 6.25 MG PO TABS: 6.2500 mg | ORAL_TABLET | Freq: Two times a day (BID) | ORAL | Status: DC

## 2012-02-22 MED ORDER — FERROUS SULFATE 325 (65 FE) MG PO TBEC: 325.0000 mg | DELAYED_RELEASE_TABLET | Freq: Three times a day (TID) | ORAL | Status: DC

## 2012-02-22 NOTE — Progress Notes (Signed)
Pt provided with dc instructions and education. Pt verbalized understnading. Pt educated on new medications and how to take them. Pt teachback learning. VSS at this time. IV Iron finished infusing. Pt has no complaints or needs at this time. IV removed with tip intact. Heart monitor cleaned and returned to front. Levonne Spiller, RN

## 2012-02-22 NOTE — Discharge Summary (Signed)
Physician Discharge Summary  KAILA DEVRIES ZOX:[id number] DOB: [birth year] DOA: 02/20/2012  PCP: Tally Due, MD  Admit date: 02/20/2012 Discharge date: 02/22/2012  Time spent: 40 minutes  Recommendations for Outpatient Follow-up:  1. Follow up with PCP in 1 week  Discharge Diagnoses:  Principal Problem:  *Iron deficiency anemia, unspecified Active Problems:  Thrombocytopenia  Bilateral lower extremity edema  Heart murmur, systolic  Menorrhagia, premenopausal  Pica in adults  HTN, goal below 140/90  Hypokalemia  CHF with unknown LVEF (mild)   Discharge Condition: stable  Diet recommendation: regular  Filed Weights   02/21/12 0250 02/21/12 0500 02/22/12 8119  Weight: 81.6 kg (179 lb 14.3 oz) 83.2 kg (183 lb 6.8 oz) 84.2 kg (185 lb 10 oz)    History of present illness:  Sonya Taylor is a 41 y.o. woman who presented to the [facility] ED on 02/21/12 with complaints of Increased Swelling in both legs noticed upon awakening. 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 This time when she awakened the swelling was worse than before she went to sleep. She denied having any Chest Pain, SOB or Orthopnea, or dyspnea on exertion. She reported having hypertension when she was pregnant, and she reported she had a heart murmur when she was a child, and she stated that she was told that it went away.   In the ED, she was found to have a BNP of 1700, and a hemoglobin level of 3.5. She denied having any hematemesis, melena, or hematochezia. She did report having menorrhagia and a menses that is regular and lasts 7 days. She also reported that she chronically eats and craves ice. She was also found to have a loud III/VI SEM on examination as well as 3+BLE Edema.    Hospital Course:  Iron deficiency anemia,  unspecified  *Patient was tolerating the anemia quite well without any headache, dizziness or shortness of breath or chest pain. This correlates that this anemia is quite chronic.Patient's menses alone do not explain her anemia  *Her iron level is quite low but her reticulocyte count is also low and given her associated pancytopenia there is concern for possible bone marrow suppression. Seen by  Dr Myna Hidalgo with hem/onc who recommended IV iron which she received on day of discharge. Also recommended discharging on folic acid and vit c. He will follow up OP basis. Noted that it will take 2-3 weeks.  Thrombocytopenia  *Unclear if secondary to chronic consumption from recurrent bleeding and chronic anemia or if this is more reflective of bone marrow dysfunction. Seen by dr Myna Hidalgo who opined that typically with iron deficiency, platelet count are high. As such, concern if she has a second hematologic issue going on. 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 By her blood smear, and she had a couple large platelets but most of the platelets were small. Will follow up on OP basis. Recommendations as above.  Bilateral lower extremity edema  *Likely related to third spacing from low hemoglobin therefore low serum protein as well as possible increased cardiac demand from persistent anemia .No evidence of DVT Dopplers  HTN, goal below 140/90  *Did not carry a formal diagnosis of chronic hypertension before admission but did have pregnancy-induced hypertension  *ACE inhibitor as well as carvedilol initiated this admission the patient does have mild renal insufficiency so  ACE inhibitor discontinued and HCTZ initiated.  Hypokalemia  *Oral repleted and resolved at discharge CHF with unknown LVEF (mild)  *Patient had mild heart failure on x-ray and elevated BNP-echocardiogram yields 55-60%. Never had any respiratory symptoms and is not hypoxic. Likely related to anemia. Stable at discharge  Heart murmur,  systolic  Likely hyperdynamic in nature related to severity of anemia  Menorrhagia, premenopausal  *Consider an intravaginal ultrasound to determine if patient has enlarged endometrial stripe and or fibroids in the event she needs surgical treatment for her menses on OP basis Pica  *Appears to be related to low iron levels OP follow up with heme     Procedures:  none  Consultations:  hematology  Discharge Exam: Filed Vitals:   02/21/12 2200 02/22/12 0609 02/22/12 0709 02/22/12 1430  BP: 166/84 165/104 166/88 165/98  Pulse:  82  85  Temp:  98.3 F (36.8 C)  99.2 F (37.3 C)  TempSrc:  Oral  Oral  Resp:  20  18  Height:      Weight:  84.2 kg (185 lb 10 oz)    SpO2:  98%  90%    General: awake alert NAD Cardiovascular: RRR No MGR  Respiratory: normal effort BSCTAB  Discharge Instructions      Discharge Orders    Future Orders Please Complete By Expires   Diet - low sodium heart healthy      Increase activity slowly      Call MD for:  persistant nausea and vomiting      Call MD for:  persistant dizziness or light-headedness      Call MD for:  extreme fatigue          Medication List     As of 02/22/2012  3:27 PM    TAKE these medications         acetaminophen 325 MG tablet   Commonly known as: TYLENOL   Take 650 mg by mouth 2 (two) times daily as needed. For pain      carvedilol 6.25 MG tablet   Commonly known as: COREG   Take 1 tablet (6.25 mg total) by mouth 2 (two) times daily with a meal.      ferrous sulfate 325 (65 FE) MG EC tablet   Take 1 tablet (325 mg total) by mouth 3 (three) times daily with meals.      folic acid 1 MG tablet   Commonly known as: FOLVITE   Take 2 tablets (2 mg total) by mouth daily.      hydrochlorothiazide 12.5 MG capsule   Commonly known as: MICROZIDE   Take 1 capsule (12.5 mg total) by mouth daily.      ibuprofen 200 MG tablet   Commonly known as: ADVIL,MOTRIN   Take 400 mg by mouth 3 (three) times daily as  needed. For pain      vitamin C 500 MG tablet   Commonly known as: ASCORBIC ACID   Take 1 tablet (500 mg total) by mouth daily.         Follow-up Information    Follow up with GUEST, Loretha Stapler, MD. Schedule an appointment as soon as possible for a visit in 1 week. (monitor cbc 1 week.  recommed close BP monitoring for optimal control)    Contact information:   [address] [place] [address] [phone]       Follow up with Josph Macho, MD. Schedule an appointment as soon as possible for a visit in 2  weeks.   Contact information:   [address] [place] [address] [phone]           The results of significant diagnostics from this hospitalization (including imaging, microbiology, ancillary and laboratory) are listed below for reference.    Significant Diagnostic Studies: Dg Chest 2 View  02/20/2012  *RADIOLOGY REPORT*  Clinical Data: Bilateral leg swelling.  CHEST - 2 VIEW  Comparison: None.  Findings: Trachea is midline.  Heart is mildly enlarged.  Mild diffuse interstitial prominence and indistinctness with peripheral septal lines at the lung bases. Probable trace bilateral pleural effusions with thickening of the fissures on the lateral view.  IMPRESSION: Mild congestive heart failure.   Original Report Authenticated By: Leanna Battles, M.D.     Microbiology: Recent Results (from the past 240 hour(s))  MRSA PCR SCREENING     Status: Normal   Collection Time   02/21/12 12:09 AM      Component Value Range Status Comment   MRSA by PCR NEGATIVE  NEGATIVE Final      Labs: Basic Metabolic Panel:  Lab 02/22/12 9147 02/21/12 1155 02/21/12 1153 02/21/12 0323 02/20/12 2000  NA 138 142 142 139 140  K 4.3 3.4* 3.4* 2.8* 3.7  CL 105 104 104 103 106  CO2 26 28 28 25 24   GLUCOSE 108* 102* 101* 119* 98  BUN 6 6 6  4* 6  CREATININE 0.54 0.53 0.55 0.56 0.60  CALCIUM 9.3 8.9 9.1 8.5 8.6  MG -- -- -- -- --  PHOS -- -- -- -- --   Liver  Function Tests:  Lab 02/21/12 1153  AST 1818  ALT 1213  ALKPHOS 9395  BILITOT 0.60.5  PROT 6.26.2  ALBUMIN 3.2*3.2*   No results found for this basename: LIPASE:5,AMYLASE:5 in the last 168 hours No results found for this basename: AMMONIA:5 in the last 168 hours CBC:  Lab 02/22/12 0450 02/21/12 1155 02/21/12 1152 02/21/12 0323 02/20/12 2020  WBC 7.3 5.4 -- 4.7 5.4  NEUTROABS -- -- -- -- --  HGB 7.9* 5.9* -- 3.5* 3.5*  HCT 24.8* 19.0* -- 13.0* 12.8*  MCV 74.5* 70.4* -- 64.4* 65.0*  PLT 32* 44* 47* 51* 64*   Cardiac Enzymes:  Lab 02/20/12 2000  CKTOTAL --  CKMB --  CKMBINDEX --  TROPONINI <0.30   BNP: BNP (last 3 results)  Basename 02/20/12 2000  PROBNP 1019.0*   CBG: No results found for this basename: GLUCAP:5 in the last 168 hours     Signed:  HERNANDEZ ACOSTA,ESTELA  [facility] 02/22/2012, 3:27 PM    Patient seen and examined. Agree with note by Toya Smothers, NP. Patient will be discharged home today in stable condition. Will take iron supplementation TID as well as vitamin c and folate. Will follow up with Dr. Myna Hidalgo outpatient.  Peggye Pitt, MD [facility]

## 2012-02-22 NOTE — Progress Notes (Signed)
Pt had short run of NSVT.  Pt asymptomatic--watching TV in bed.  BP 164/90.  Strip posted in chart. Will continue to monitor. Dierdre Highman, RN

## 2012-02-22 NOTE — Progress Notes (Signed)
IV INFED test dose given--pt tolerated well. Dierdre Highman, RN

## 2012-02-24 ENCOUNTER — Other Ambulatory Visit: Payer: Self-pay | Admitting: Hematology & Oncology

## 2012-02-24 ENCOUNTER — Telehealth: Payer: Self-pay | Admitting: Hematology & Oncology

## 2012-02-24 DIAGNOSIS — D509 Iron deficiency anemia, unspecified: Secondary | ICD-10-CM

## 2012-02-24 DIAGNOSIS — D696 Thrombocytopenia, unspecified: Secondary | ICD-10-CM

## 2012-02-24 NOTE — Telephone Encounter (Signed)
Left pt message with 1-6 appointment and to call for address.

## 2012-02-28 ENCOUNTER — Other Ambulatory Visit (HOSPITAL_BASED_OUTPATIENT_CLINIC_OR_DEPARTMENT_OTHER): Payer: BC Managed Care – PPO | Admitting: Lab

## 2012-02-28 ENCOUNTER — Ambulatory Visit (HOSPITAL_BASED_OUTPATIENT_CLINIC_OR_DEPARTMENT_OTHER): Payer: BC Managed Care – PPO | Admitting: Hematology & Oncology

## 2012-02-28 VITALS — BP 149/68 | HR 70 | Temp 98.7°F | Resp 16 | Ht 66.0 in | Wt 167.0 lb

## 2012-02-28 DIAGNOSIS — D696 Thrombocytopenia, unspecified: Secondary | ICD-10-CM

## 2012-02-28 DIAGNOSIS — D509 Iron deficiency anemia, unspecified: Secondary | ICD-10-CM

## 2012-02-28 LAB — TECHNOLOGIST REVIEW CHCC SATELLITE

## 2012-02-28 LAB — CBC WITH DIFFERENTIAL (CANCER CENTER ONLY)
BASO%: 1.2 % (ref 0.0–2.0)
Eosinophils Absolute: 0.3 10*3/uL (ref 0.0–0.5)
LYMPH%: 22.4 % (ref 14.0–48.0)
MCHC: 29.8 g/dL — ABNORMAL LOW (ref 32.0–36.0)
MCV: 83 fL (ref 81–101)
MONO%: 9.6 % (ref 0.0–13.0)
NEUT#: 6.2 10*3/uL (ref 1.5–6.5)
Platelets: 273 10*3/uL (ref 145–400)
WBC: 9.9 10*3/uL (ref 3.9–10.0)

## 2012-02-28 LAB — IRON AND TIBC
%SAT: 57 % — ABNORMAL HIGH (ref 20–55)
Iron: 292 ug/dL — ABNORMAL HIGH (ref 42–145)

## 2012-02-28 LAB — CHCC SATELLITE - SMEAR

## 2012-02-28 NOTE — Progress Notes (Signed)
This office note has been dictated.

## 2012-02-29 NOTE — Progress Notes (Signed)
DIAGNOSES: 1. Severe iron deficiency anemia secondary to menometrorrhagia. 2. Transient thrombocytopenia.  CURRENT THERAPY:  The patient is status post iron dextran in the hospital.  INTERIM HISTORY:  This is Ms. Voisin's first visit to our office.  I saw her in consultation at [facility] back on December 30.  She had come in with a hemoglobin of I think 3.5.  This was incredibly shockingly to me.  This obviously had been going on for a while.  She got I think 4 units of blood in the hospital.  I did do a blood smear.  She was clearly iron deficient.  Her ferritin was 9.  Her TIBC was 559.  She had a folate that was mildly decreased.  Again, we gave her a dose of iron dextran.  I gave her a 2 g dose.  She tolerated this well.  Upon discharge she was put on folic acid.  She was also was given oral iron which she does not need.  She also was put on vitamin C.  She is feeling better.  Her leg swelling has gone down quite nicely. She just feels better.  She still is somewhat fatigued, so she is not ready to go back to work.  There has been no bleeding outside that with her monthly cycle.  PHYSICAL EXAMINATION:  General:  This is a well-developed, well- nourished black female in no obvious distress.  Vital signs:  Show a temperature of 98.7, pulse 70, respiratory rate 16, blood pressure is 149/68.  Weight is 167.  Head and neck:  Shows a normocephalic, atraumatic skull.  There are no ocular or oral lesions.  There are no palpable cervical or supraclavicular lymph nodes.  Lungs:  Clear bilaterally.  Cardiac:  Regular rate and rhythm with a normal S1, S2. There may be a 1/6 systolic ejection murmur.  Abdomen:  Soft with good bowel sounds.  There is no palpable abdominal mass.  There is no fluid wave.  There is no palpable hepatosplenomegaly.  Back:  No tenderness over the spine, ribs or hips.  Extremities:  Show minimal edema in her legs.  She has good range of motion of her  joints.  Skin:  No rashes, ecchymoses or petechiae.  Neurologic:  No focal neurological deficits.  LABORATORY STUDIES:  White cell count 9.9, hemoglobin 9.8, hematocrit 32.9, platelet count 273.  MCV is 83.  Her ferritin is 1566.  Iron saturation is 57%.  Iron is 292.  Her blood smear shows still increased population of red blood cells. She has some microcytic cells.  She has some polychromasia.  She has no nucleated red cells.  I see no teardrop cells.  There is no rouleaux formation.  White cells appear normal in morphology and maturation. Platelets are adequate in number and size.  IMPRESSION:  Ms. Finamore is a 42 year old African American female with iron deficiency anemia.  I cannot find any other source for the iron deficiency outside of her having her monthly cycles.  She has had C- sections in the past.  She is responding to the iron.  Her indices are coming up.  Her hemoglobin is improving.  I think that we just have to be patient and allow the iron that she received last week to continue to work.  She does not need to be on oral iron.  I still think she needs to be out of work for a couple of weeks.  I think she just needs to recover from being in  the hospital and having such a low hemoglobin.  As such, I believe that she would be able to go back to work in 2 weeks if she is up for this.  I am pleased that her platelet count has come back up.  I am not sure why it was so low in the hospital.  Again, her blood smear was unremarkable.  I think we can get her back in a month to see [place].  Hopefully, her hemoglobin should be close to 11, if not higher.  Addendum:  Ms. Mcphee definitely needs to have a family doctor.  I talked to her about this.  She has not had a mammogram yet.  We will have to order mammograms for her.    ______________________________ Josph Macho, M.D. PRE/MEDQ  D:  02/28/2012  T:  02/29/2012  Job:  [id number]

## 2012-03-10 ENCOUNTER — Encounter: Payer: Self-pay | Admitting: *Deleted

## 2012-03-27 ENCOUNTER — Other Ambulatory Visit: Payer: BC Managed Care – PPO | Admitting: Lab

## 2012-03-27 ENCOUNTER — Ambulatory Visit: Payer: BC Managed Care – PPO | Admitting: Medical

## 2012-03-29 ENCOUNTER — Telehealth: Payer: Self-pay | Admitting: Hematology & Oncology

## 2012-03-29 NOTE — Telephone Encounter (Signed)
Left pt message to call and reschedule missed appointment from 03-27-12

## 2012-09-18 ENCOUNTER — Other Ambulatory Visit: Payer: Self-pay

## 2012-09-18 DIAGNOSIS — Z1231 Encounter for screening mammogram for malignant neoplasm of breast: Secondary | ICD-10-CM

## 2012-09-27 ENCOUNTER — Ambulatory Visit
Admission: RE | Admit: 2012-09-27 | Discharge: 2012-09-27 | Disposition: A | Discharge status: Home / Self Care | Payer: BC Managed Care – PPO | Source: Ambulatory Visit

## 2012-09-27 ENCOUNTER — Other Ambulatory Visit: Payer: Self-pay | Admitting: Family Medicine

## 2012-09-27 DIAGNOSIS — Z1231 Encounter for screening mammogram for malignant neoplasm of breast: Secondary | ICD-10-CM

## 2012-09-28 ENCOUNTER — Other Ambulatory Visit: Payer: Self-pay | Admitting: Family Medicine

## 2012-09-28 DIAGNOSIS — R928 Other abnormal and inconclusive findings on diagnostic imaging of breast: Secondary | ICD-10-CM

## 2012-10-16 ENCOUNTER — Other Ambulatory Visit: Payer: BC Managed Care – PPO

## 2012-11-03 ENCOUNTER — Ambulatory Visit
Admission: RE | Admit: 2012-11-03 | Discharge: 2012-11-03 | Disposition: A | Discharge status: Home / Self Care | Payer: BC Managed Care – PPO | Source: Ambulatory Visit | Attending: Family Medicine | Admitting: Family Medicine

## 2012-11-03 DIAGNOSIS — R928 Other abnormal and inconclusive findings on diagnostic imaging of breast: Secondary | ICD-10-CM

## 2013-05-17 ENCOUNTER — Encounter (HOSPITAL_BASED_OUTPATIENT_CLINIC_OR_DEPARTMENT_OTHER): Payer: Self-pay | Admitting: Emergency Medicine

## 2013-05-17 ENCOUNTER — Emergency Department (HOSPITAL_BASED_OUTPATIENT_CLINIC_OR_DEPARTMENT_OTHER)
Admission: EM | Admit: 2013-05-17 | Discharge: 2013-05-17 | Disposition: A | Discharge status: Home / Self Care | Payer: BC Managed Care – PPO | Attending: Emergency Medicine | Admitting: Emergency Medicine

## 2013-05-17 DIAGNOSIS — Z79899 Other long term (current) drug therapy: Secondary | ICD-10-CM | POA: Insufficient documentation

## 2013-05-17 DIAGNOSIS — F17200 Nicotine dependence, unspecified, uncomplicated: Secondary | ICD-10-CM | POA: Insufficient documentation

## 2013-05-17 DIAGNOSIS — K047 Periapical abscess without sinus: Secondary | ICD-10-CM

## 2013-05-17 MED ORDER — AMOXICILLIN 500 MG PO CAPS: 500.0000 mg | ORAL_CAPSULE | Freq: Three times a day (TID) | ORAL | Status: DC

## 2013-05-17 NOTE — ED Provider Notes (Signed)
CSN: [id number]     Arrival date & time 05/17/13  1721 History   First MD Initiated Contact with Patient 05/17/13 1731     Chief Complaint  Patient presents with  . Oral Swelling     (Consider location/radiation/quality/duration/timing/severity/associated sxs/prior Treatment) HPI Comments: Patient is 43 year old female who presents with left lower jaw swelling - she has limited teeth but thinks this may be a dental abscess - she denies pain to the area.  States she awoke this morning with swelling around her left eye and into her left jaw area - she intially thought this was related to allergies so she took some benadryl.  She states no fever, chills, shortness of breath, difficulty opening her mouth or swallowing.  The history is provided by the patient. No language interpreter was used.    Past Medical History  Diagnosis Date  . PIH (pregnancy induced hypertension)    Past Surgical History  Procedure Laterality Date  . Cesarean section     No family history on file. History  Substance Use Topics  . Smoking status: Current Some Day Smoker  . Smokeless tobacco: Not on file  . Alcohol Use: No   OB History   Grav Para Term Preterm Abortions TAB SAB Ect Mult Living                 Review of Systems  All other systems reviewed and are negative.      Allergies  Review of patient's allergies indicates no known allergies.  Home Medications   Current Outpatient Rx  Name  Route  Sig  Dispense  Refill  . acetaminophen (TYLENOL) 325 MG tablet   Oral   Take 650 mg by mouth 2 (two) times daily as needed. For pain         . carvedilol (COREG) 6.25 MG tablet   Oral   Take 1 tablet (6.25 mg total) by mouth 2 (two) times daily with a meal.   30 tablet   0   . ferrous sulfate 325 (65 FE) MG EC tablet   Oral   Take 1 tablet (325 mg total) by mouth 3 (three) times daily with meals.      3   . folic acid (FOLVITE) 1 MG tablet   Oral   Take 2 tablets (2 mg total) by  mouth daily.   30 tablet      . hydrochlorothiazide (MICROZIDE) 12.5 MG capsule   Oral   Take 1 capsule (12.5 mg total) by mouth daily.   30 capsule   0   . ibuprofen (ADVIL,MOTRIN) 200 MG tablet   Oral   Take 400 mg by mouth 3 (three) times daily as needed. For pain         . vitamin C (ASCORBIC ACID) 500 MG tablet   Oral   Take 1 tablet (500 mg total) by mouth daily.          BP 165/110  Pulse 84  Temp(Src) 98.6 F (37 C) (Oral)  Resp 20  Ht 5\' 6"  (1.676 m)  Wt 180 lb (81.647 kg)  BMI 29.07 kg/m2  SpO2 100%  LMP 05/15/2013 Physical Exam  Nursing note and vitals reviewed. Constitutional: She is oriented to person, place, and time. She appears well-developed and well-nourished. No distress.  HENT:  Right Ear: External ear normal.  Left Ear: External ear normal.  Mouth/Throat: Oropharynx is clear and moist. No oropharyngeal exudate.  Left mandibular swelling without distinct abscess noted,  no sublingual edema, several necrotic teeth noted   Eyes: Conjunctivae are normal. Pupils are equal, round, and reactive to light. No scleral icterus.  Neck: Normal range of motion. Neck supple.  Pulmonary/Chest: Effort normal.  Musculoskeletal: Normal range of motion. She exhibits no edema and no tenderness.  Neurological: She is alert and oriented to person, place, and time. She exhibits normal muscle tone. Coordination normal.  Skin: Skin is warm and dry. No rash noted. No erythema. No pallor.  Psychiatric: She has a normal mood and affect. Her behavior is normal. Judgment and thought content normal.    ED Course  Procedures (including critical care time) Labs Review Labs Reviewed - No data to display Imaging Review No results found.   EKG Interpretation None      MDM   Dental abscess  Patient here with painless left mandibular swelling - likely abscess - perhaps early formation, will start on antibiotics.  Doubt ludwig's angina, PTA, no evidence of airway  compromise.    Izola PriceFrances C. Marisue HumbleSanford, PA-C 05/17/13 1742

## 2013-05-17 NOTE — ED Notes (Signed)
Abscess tooth. Swelling since yesterday.

## 2013-05-17 NOTE — ED Provider Notes (Signed)
Medical screening examination/treatment/procedure(s) were performed by non-physician practitioner and as supervising physician I was immediately available for consultation/collaboration.   EKG Interpretation None        Phu Record, MD 05/17/13 1816 

## 2013-05-17 NOTE — Discharge Instructions (Signed)
°  Dental Abscess °A dental abscess is a collection of infected fluid (pus) from a bacterial infection in the inner part of the tooth (pulp). It usually occurs at the end of the tooth's root.  °CAUSES  °· Severe tooth decay. °· Trauma to the tooth that allows bacteria to enter into the pulp, such as a broken or chipped tooth. °SYMPTOMS  °· Severe pain in and around the infected tooth. °· Swelling and redness around the abscessed tooth or in the mouth or face. °· Tenderness. °· Pus drainage. °· Bad breath. °· Bitter taste in the mouth. °· Difficulty swallowing. °· Difficulty opening the mouth. °· Nausea. °· Vomiting. °· Chills. °· Swollen neck glands. °DIAGNOSIS  °· A medical and dental history will be taken. °· An examination will be performed by tapping on the abscessed tooth. °· X-rays may be taken of the tooth to identify the abscess. °TREATMENT °The goal of treatment is to eliminate the infection. You may be prescribed antibiotic medicine to stop the infection from spreading. A root canal may be performed to save the tooth. If the tooth cannot be saved, it may be pulled (extracted) and the abscess may be drained.  °HOME CARE INSTRUCTIONS °· Only take over-the-counter or prescription medicines for pain, fever, or discomfort as directed by your caregiver. °· Rinse your mouth (gargle) often with salt water (¼ tsp salt in 8 oz [250 ml] of warm water) to relieve pain or swelling. °· Do not drive after taking pain medicine (narcotics). °· Do not apply heat to the outside of your face. °· Return to your dentist for further treatment as directed. °SEEK MEDICAL CARE IF: °· Your pain is not helped by medicine. °· Your pain is getting worse instead of better. °SEEK IMMEDIATE MEDICAL CARE IF: °· You have a fever or persistent symptoms for more than 2 3 days. °· You have a fever and your symptoms suddenly get worse. °· You have chills or a very bad headache. °· You have problems breathing or swallowing. °· You have trouble  opening your mouth. °· You have swelling in the neck or around the eye. °Document Released: 02/08/2005 Document Revised: 11/03/2011 Document Reviewed: 05/19/2010 °ExitCare® Patient Information ©2014 [employer]. ° ° °

## 2013-11-20 ENCOUNTER — Ambulatory Visit (INDEPENDENT_AMBULATORY_CARE_PROVIDER_SITE_OTHER): Payer: BC Managed Care – PPO | Admitting: Family Medicine

## 2013-11-20 ENCOUNTER — Other Ambulatory Visit: Payer: Self-pay

## 2013-11-20 VITALS — BP 186/127 | HR 72 | Temp 98.4°F | Resp 20 | Ht 66.0 in | Wt 180.4 lb

## 2013-11-20 DIAGNOSIS — I10 Essential (primary) hypertension: Secondary | ICD-10-CM

## 2013-11-20 LAB — CBC WITH DIFFERENTIAL/PLATELET
BASOS ABS: 0.1 10*3/uL (ref 0.0–0.1)
BASOS PCT: 1 % (ref 0–1)
EOS PCT: 3 % (ref 0–5)
Eosinophils Absolute: 0.2 10*3/uL (ref 0.0–0.7)
HEMATOCRIT: 31.7 % — AB (ref 36.0–46.0)
Hemoglobin: 9.7 g/dL — ABNORMAL LOW (ref 12.0–15.0)
LYMPHS PCT: 45 % (ref 12–46)
Lymphs Abs: 3.1 10*3/uL (ref 0.7–4.0)
MCH: 23.4 pg — ABNORMAL LOW (ref 26.0–34.0)
MCHC: 30.6 g/dL (ref 30.0–36.0)
MCV: 76.6 fL — AB (ref 78.0–100.0)
MONO ABS: 0.5 10*3/uL (ref 0.1–1.0)
MONOS PCT: 8 % (ref 3–12)
Neutro Abs: 2.9 10*3/uL (ref 1.7–7.7)
Neutrophils Relative %: 43 % (ref 43–77)
Platelets: 392 10*3/uL (ref 150–400)
RBC: 4.14 MIL/uL (ref 3.87–5.11)
RDW: 17.5 % — AB (ref 11.5–15.5)
WBC: 6.8 10*3/uL (ref 4.0–10.5)

## 2013-11-20 LAB — LIPID PANEL
CHOL/HDL RATIO: 3.2 ratio
CHOLESTEROL: 193 mg/dL (ref 0–200)
HDL: 61 mg/dL (ref >39)
LDL Cholesterol: 106 mg/dL — ABNORMAL HIGH (ref 0–99)
TRIGLYCERIDES: 128 mg/dL (ref <150)
VLDL: 26 mg/dL (ref 0–40)

## 2013-11-20 LAB — COMPREHENSIVE METABOLIC PANEL
ALT: 48 U/L — ABNORMAL HIGH (ref 0–35)
AST: 64 U/L — AB (ref 0–37)
Albumin: 4.2 g/dL (ref 3.5–5.2)
Alkaline Phosphatase: 71 U/L (ref 39–117)
BUN: 13 mg/dL (ref 6–23)
CALCIUM: 9.7 mg/dL (ref 8.4–10.5)
CHLORIDE: 104 meq/L (ref 96–112)
CO2: 26 mEq/L (ref 19–32)
CREATININE: 0.71 mg/dL (ref 0.50–1.10)
GLUCOSE: 100 mg/dL — AB (ref 70–99)
Potassium: 4.7 mEq/L (ref 3.5–5.3)
Sodium: 139 mEq/L (ref 135–145)
Total Bilirubin: 0.3 mg/dL (ref 0.2–1.2)
Total Protein: 7.1 g/dL (ref 6.0–8.3)

## 2013-11-20 MED ORDER — AMLODIPINE BESYLATE 5 MG PO TABS: ORAL_TABLET | ORAL | Status: DC

## 2013-11-20 MED ORDER — HYDROCHLOROTHIAZIDE 12.5 MG PO CAPS: 12.5000 mg | ORAL_CAPSULE | Freq: Every day | ORAL | Status: AC

## 2013-11-20 MED ORDER — HYDROCHLOROTHIAZIDE 12.5 MG PO CAPS: 12.5000 mg | ORAL_CAPSULE | Freq: Every day | ORAL | Status: DC

## 2013-11-20 NOTE — Progress Notes (Signed)
   Subjective:    Patient ID: Sonya Taylor, female    DOB: March 28, 1970, 43 y.o.   MRN: [id number]  HPI Patient was scheduled for elective foot surgery this morning. When she arrived at the surgery center, she was found to be hypertensive and her surgery was postponed until her BP is controlled. She has a history of HTN and was previously on coreg 6.25 and HCTZ 12.5. She has not been taking any medication for HTN for at least a year. She has a history of iron deficiency anemia from abnormal uterine bleeding in the past and was taking iron/Vit. C/folic acid. She has been taking a multivitamin instead.   She has a great deal of stress at her job at [employer]. There is always a threat of a lay off. She has little stress outside of work. Her sons are through college and out of the house, so she feels it is time for her to concentrate on herself and her health. She reports that her motivation is high.  Review of Systems No headache, no visual changes, no chest pain, no SOB, no edema,     Objective:   Physical Exam  Vitals reviewed. Constitutional: She is oriented to person, place, and time. She appears well-developed and well-nourished.  HENT:  Head: Normocephalic and atraumatic.  Right Ear: External ear normal.  Nose: Nose normal.  Mouth/Throat: Oropharynx is clear and moist.  Eyes: Conjunctivae and EOM are normal. Pupils are equal, round, and reactive to light.  Neck: Normal range of motion. Neck supple. No JVD present. Carotid bruit is not present.  Cardiovascular: Normal rate, regular rhythm and normal heart sounds.   Pulmonary/Chest: Effort normal and breath sounds normal.  Musculoskeletal: Normal range of motion. She exhibits no edema.  Lymphadenopathy:    She has no cervical adenopathy.  Neurological: She is alert and oriented to person, place, and time.  Skin: Skin is warm and dry.  Psychiatric: She has a normal mood and affect. Her behavior is normal. Judgment and thought content  normal.      Assessment & Plan:  1. Essential hypertension -Provided written and verbal information regarding diagnosis and treatment. - CBC with Differential - Comprehensive metabolic panel - Lipid panel - amLODipine (NORVASC) 5 MG tablet; Take 1/2 tablet by mouth once a day for 1 week, then increase to 1 tablet a day.  Dispense: 30 tablet; Refill: 3 -provided information on DASH diet -Follow up with CPE in 1 week- patient given appointment time -Faxed information to surgery center regarding plan  Emi Belfasteborah B. Gessner, FNP-BC  [facility]  11/20/2013 2:52 PM

## 2013-11-20 NOTE — Patient Instructions (Addendum)
I have scheduled you for an appointment at the [facility] (next door to the [facility]) for Tuesday, October 6 at 2 pm.   DASH Eating Plan DASH stands for "Dietary Approaches to Stop Hypertension." The DASH eating plan is a healthy eating plan that has been shown to reduce high blood pressure (hypertension). Additional health benefits may include reducing the risk of type 2 diabetes mellitus, heart disease, and stroke. The DASH eating plan may also help with weight loss. WHAT DO I NEED TO KNOW ABOUT THE DASH EATING PLAN? For the DASH eating plan, you will follow these general guidelines:  Choose foods with a percent daily value for sodium of less than 5% (as listed on the food label).  Use salt-free seasonings or herbs instead of table salt or sea salt.  Check with your health care provider or pharmacist before using salt substitutes.  Eat lower-sodium products, often labeled as "lower sodium" or "no salt added."  Eat fresh foods.  Eat more vegetables, fruits, and low-fat dairy products.  Choose whole grains. Look for the word "whole" as the first word in the ingredient list.  Choose fish and skinless chicken or [place] more often than red meat. Limit fish, poultry, and meat to 6 oz (170 g) each day.  Limit sweets, desserts, sugars, and sugary drinks.  Choose heart-healthy fats.  Limit cheese to 1 oz (28 g) per day.  Eat more home-cooked food and less restaurant, buffet, and fast food.  Limit fried foods.  Cook foods using methods other than frying.  Limit canned vegetables. If you do use them, rinse them well to decrease the sodium.  When eating at a restaurant, ask that your food be prepared with less salt, or no salt if possible. WHAT FOODS CAN I EAT? Seek help from a dietitian for individual calorie needs. Grains Whole grain or whole wheat bread. Brown rice. Whole grain or whole wheat pasta. Quinoa, bulgur, and whole grain cereals. Low-sodium cereals. Corn  or whole wheat flour tortillas. Whole grain cornbread. Whole grain crackers. Low-sodium crackers. Vegetables Fresh or frozen vegetables (raw, steamed, roasted, or grilled). Low-sodium or reduced-sodium tomato and vegetable juices. Low-sodium or reduced-sodium tomato sauce and paste. Low-sodium or reduced-sodium canned vegetables.  Fruits All fresh, canned (in natural juice), or frozen fruits. Meat and Other Protein Products Ground beef (85% or leaner), grass-fed beef, or beef trimmed of fat. Skinless chicken or [place]. Ground chicken or [place]. Pork trimmed of fat. All fish and seafood. Eggs. Dried beans, peas, or lentils. Unsalted nuts and seeds. Unsalted canned beans. Dairy Low-fat dairy products, such as skim or 1% milk, 2% or reduced-fat cheeses, low-fat ricotta or cottage cheese, or plain low-fat yogurt. Low-sodium or reduced-sodium cheeses. Fats and Oils Tub margarines without trans fats. Light or reduced-fat mayonnaise and salad dressings (reduced sodium). Avocado. Safflower, olive, or canola oils. Natural peanut or almond butter. Other Unsalted popcorn and pretzels. The items listed above may not be a complete list of recommended foods or beverages. Contact your dietitian for more options. WHAT FOODS ARE NOT RECOMMENDED? Grains White bread. White pasta. White rice. Refined cornbread. Bagels and croissants. Crackers that contain trans fat. Vegetables Creamed or fried vegetables. Vegetables in a cheese sauce. Regular canned vegetables. Regular canned tomato sauce and paste. Regular tomato and vegetable juices. Fruits Dried fruits. Canned fruit in light or heavy syrup. Fruit juice. Meat and Other Protein Products Fatty cuts of meat. Ribs, chicken wings, bacon, sausage, bologna, salami, chitterlings, fatback, hot dogs,  bratwurst, and packaged luncheon meats. Salted nuts and seeds. Canned beans with salt. Dairy Whole or 2% milk, cream, half-and-half, and cream cheese. Whole-fat or  sweetened yogurt. Full-fat cheeses or blue cheese. Nondairy creamers and whipped toppings. Processed cheese, cheese spreads, or cheese curds. Condiments Onion and garlic salt, seasoned salt, table salt, and sea salt. Canned and packaged gravies. Worcestershire sauce. Tartar sauce. Barbecue sauce. Teriyaki sauce. Soy sauce, including reduced sodium. Steak sauce. Fish sauce. Oyster sauce. Cocktail sauce. Horseradish. Ketchup and mustard. Meat flavorings and tenderizers. Bouillon cubes. Hot sauce. Tabasco sauce. Marinades. Taco seasonings. Relishes. Fats and Oils Butter, stick margarine, lard, shortening, ghee, and bacon fat. Coconut, palm kernel, or palm oils. Regular salad dressings. Other Pickles and olives. Salted popcorn and pretzels. The items listed above may not be a complete list of foods and beverages to avoid. Contact your dietitian for more information. WHERE CAN I FIND MORE INFORMATION? National Heart, Lung, and Blood Institute: [employer] Document Released: 01/28/2011 Document Revised: 06/25/2013 Document Reviewed: 12/13/2012 [facility] Patient Information 2015 [place], [place]. This information is not intended to replace advice given to you by your health care provider. Make sure you discuss any questions you have with your health care provider.

## 2013-11-23 ENCOUNTER — Telehealth: Payer: Self-pay | Admitting: Family Medicine

## 2013-11-26 ENCOUNTER — Telehealth: Payer: Self-pay | Admitting: Family Medicine

## 2013-11-26 NOTE — Telephone Encounter (Signed)
Patient came in today for BP check. BP 182/98. She has appt tomorrow at 104

## 2013-11-26 NOTE — Progress Notes (Signed)
Appt. Scheduled.

## 2013-11-27 ENCOUNTER — Ambulatory Visit (INDEPENDENT_AMBULATORY_CARE_PROVIDER_SITE_OTHER): Payer: BC Managed Care – PPO | Admitting: Family Medicine

## 2013-11-27 ENCOUNTER — Encounter: Payer: Self-pay | Admitting: Family Medicine

## 2013-11-27 VITALS — BP 168/98 | HR 88 | Temp 98.2°F | Resp 16 | Ht 64.25 in | Wt 187.0 lb

## 2013-11-27 DIAGNOSIS — I10 Essential (primary) hypertension: Secondary | ICD-10-CM

## 2013-11-27 DIAGNOSIS — D509 Iron deficiency anemia, unspecified: Secondary | ICD-10-CM

## 2013-11-27 DIAGNOSIS — Z1239 Encounter for other screening for malignant neoplasm of breast: Secondary | ICD-10-CM

## 2013-11-27 DIAGNOSIS — Z0000 Encounter for general adult medical examination without abnormal findings: Secondary | ICD-10-CM

## 2013-11-27 NOTE — Progress Notes (Signed)
   Subjective:    Patient ID: Sonya Taylor, female    DOB: [birth year], 43 y.o.   MRN: [id number]  HPI This is a very pleasant 43 yo female who presents today for CPE without annual gynecologic exam. She has a history of iron deficiency anemia related to abnormal uterine bleeding and has an appointment with gynecologist next week.   The patient was seen last week with elevated BP that resulted in her having her elective foot surgery cancelled. She was prescribed amlodipine 5 mg and hctz 12.5 mg and instructed to take 1/2 tablet of amlodipine for one week then increase to a whole tablet. The patient reports that she was only given the amlodipine prescription at the pharmacy and she has been taking 1/2 tablet without hctz. She was given dietary information and encouraged to stop smoking. She reports she has been watching her diet and has stopped smoking 3 days ago.   BP Readings from Last 3 Encounters:  11/27/13 168/98  11/20/13 186/127  05/17/13 165/110   She also had a BP reading yesterday in our office that was 182/98.  The patient works second shift for [employer] and is constantly under stress due to job insecurity. She has been on leave for her foot surgery and reports that her neck stiffness has significantly improved.   Review of Systems  Constitutional: Positive for unexpected weight change.  HENT: Positive for dental problem.   Endocrine: Positive for polyuria.  Genitourinary: Positive for menstrual problem.  Musculoskeletal: Positive for neck stiffness.  Skin: Positive for color change.       Objective:   Physical Exam  Vitals reviewed. Constitutional: She is oriented to person, place, and time. She appears well-developed and well-nourished.  HENT:  Head: Normocephalic and atraumatic.  Right Ear: External ear normal.  Left Ear: External ear normal.  Nose: Nose normal.  Mouth/Throat: Uvula is midline. Abnormal dentition (multiple missing teeth.). No oropharyngeal  exudate.  Eyes: Conjunctivae are normal. Pupils are equal, round, and reactive to light.  Neck: Normal range of motion. Neck supple.  Cardiovascular: Normal rate, regular rhythm and normal heart sounds.   Pulmonary/Chest: Effort normal and breath sounds normal.  Abdominal: Bowel sounds are normal. She exhibits no distension and no mass. There is no tenderness. There is no rebound and no guarding.  Musculoskeletal: Normal range of motion.  Lymphadenopathy:    She has no cervical adenopathy.  Neurological: She is alert and oriented to person, place, and time. She has normal reflexes.  Skin: Skin is warm and dry.  Psychiatric: She has a normal mood and affect. Her behavior is normal. Judgment and thought content normal.      Assessment & Plan:  1. Annual physical exam  2. Screening for breast cancer - MM Digital Screening; Future  3. Essential hypertension -Patient to increase amlodipine 5 mg to whole tablet daily starting tomorrow, BP recheck in 3 days, then plan to add HCTZ as previously planned.  4. Anemia, iron deficiency -Gyn as scheduled -hemoccult cards given to patient- she will bring in when she returns for BP recheck.  Emi Belfasteborah B. Gessner, FNP-BC  [facility]  11/29/2013 8:04 PM

## 2013-11-27 NOTE — Patient Instructions (Signed)
Recent Results (from the past 2160 hour(s))  CBC WITH DIFFERENTIAL     Status: Abnormal   Collection Time    11/20/13 11:26 AM      Result Value Ref Range   WBC 6.8  4.0 - 10.5 K/uL   RBC 4.14  3.87 - 5.11 MIL/uL   Hemoglobin 9.7 (*) 12.0 - 15.0 g/dL   HCT 31.7 (*) 36.0 - 46.0 %   MCV 76.6 (*) 78.0 - 100.0 fL   MCH 23.4 (*) 26.0 - 34.0 pg   MCHC 30.6  30.0 - 36.0 g/dL   RDW 17.5 (*) 11.5 - 15.5 %   Platelets 392  150 - 400 K/uL   Neutrophils Relative % 43  43 - 77 %   Neutro Abs 2.9  1.7 - 7.7 K/uL   Lymphocytes Relative 45  12 - 46 %   Lymphs Abs 3.1  0.7 - 4.0 K/uL   Monocytes Relative 8  3 - 12 %   Monocytes Absolute 0.5  0.1 - 1.0 K/uL   Eosinophils Relative 3  0 - 5 %   Eosinophils Absolute 0.2  0.0 - 0.7 K/uL   Basophils Relative 1  0 - 1 %   Basophils Absolute 0.1  0.0 - 0.1 K/uL   Smear Review Criteria for review not met    COMPREHENSIVE METABOLIC PANEL     Status: Abnormal   Collection Time    11/20/13 11:26 AM      Result Value Ref Range   Sodium 139  135 - 145 mEq/L   Potassium 4.7  3.5 - 5.3 mEq/L   Chloride 104  96 - 112 mEq/L   CO2 26  19 - 32 mEq/L   Glucose, Bld 100 (*) 70 - 99 mg/dL   BUN 13  6 - 23 mg/dL   Creat 0.71  0.50 - 1.10 mg/dL   Total Bilirubin 0.3  0.2 - 1.2 mg/dL   Alkaline Phosphatase 71  39 - 117 U/L   AST 64 (*) 0 - 37 U/L   ALT 48 (*) 0 - 35 U/L   Total Protein 7.1  6.0 - 8.3 g/dL   Albumin 4.2  3.5 - 5.2 g/dL   Calcium 9.7  8.4 - 10.5 mg/dL  LIPID PANEL     Status: Abnormal   Collection Time    11/20/13 11:26 AM      Result Value Ref Range   Cholesterol 193  0 - 200 mg/dL   Comment: ATP III Classification:           < 200        mg/dL        Desirable          200 - 239     mg/dL        Borderline High          >= 240        mg/dL        High         Triglycerides 128  <150 mg/dL   HDL 61  >39 mg/dL   Total CHOL/HDL Ratio 3.2     VLDL 26  0 - 40 mg/dL   LDL Cholesterol 106 (*) 0 - 99 mg/dL   Comment:       Total  Cholesterol/HDL Ratio:CHD Risk  Coronary Heart Disease Risk Table                                            Men       Women              1/2 Average Risk              3.4        3.3                  Average Risk              5.0        4.4               2X Average Risk              9.6        7.1               3X Average Risk             23.4       11.0     Use the calculated Patient Ratio above and the CHD Risk table      to determine the patient's CHD Risk.     ATP III Classification (LDL):           < 100        mg/dL         Optimal          100 - 129     mg/dL         Near or Above Optimal          130 - 159     mg/dL         Borderline High          160 - 189     mg/dL         High           > 190        mg/dL         Very High        Make sure gynecologist knows you have had your yearly physical and charges only for gynecological exam.

## 2013-11-30 ENCOUNTER — Ambulatory Visit (INDEPENDENT_AMBULATORY_CARE_PROVIDER_SITE_OTHER): Payer: BC Managed Care – PPO | Admitting: Family Medicine

## 2013-11-30 VITALS — BP 154/92

## 2013-11-30 DIAGNOSIS — R195 Other fecal abnormalities: Secondary | ICD-10-CM

## 2013-11-30 LAB — HEMOCCULT GUIAC POC 1CARD (OFFICE)
Card #2 Fecal Occult Blod, POC: NEGATIVE
Card #3 Fecal Occult Blood, POC: NEGATIVE
Fecal Occult Blood, POC: POSITIVE

## 2013-11-30 NOTE — Addendum Note (Signed)
Addended by: Thelma BargeICHARDSON, Rahul Malinak D on: 11/30/2013 01:21 PM   Modules accepted: Orders

## 2013-11-30 NOTE — Progress Notes (Signed)
   Subjective:    Patient ID: Sonya Taylor, female    DOB: Jul 06, 1970, 43 y.o.   MRN: [id number]  HPI Patient came in today for bp check and to have clearance for foot surgery. She is currently taking amlodipine 5 mg and HCTZ 12.5 mg (she has been taking both of these for 3 days).  Review of Systems     Objective:   Physical Exam  BP Readings from Last 3 Encounters:  11/30/13 154/92  11/27/13 168/98  11/20/13 186/127         Assessment & Plan:  I did not see the patient today, she stopped by for BP visit only. It was noted that her hemoccult cards had 1 card positive for blood. I called the patient about this and will order GI referral. She will keep her gyn appointment for next week and she is cleared for her bunion surgery. I discussed this with Dr. Katrinka BlazingSmith.

## 2013-11-30 NOTE — Addendum Note (Signed)
Addended by: Thelma BargeICHARDSON, SHEKETIA D on: 11/30/2013 12:55 PM   Modules accepted: Orders

## 2013-12-06 ENCOUNTER — Ambulatory Visit: Payer: BC Managed Care – PPO | Admitting: Internal Medicine

## 2013-12-14 NOTE — Telephone Encounter (Signed)
error 

## 2013-12-21 IMAGING — CR DG CHEST 2V
2 series · 2 of 2 positions shown · non-contrast
Comparison: None.

CLINICAL DATA: Bilateral leg swelling.

CHEST - 2 VIEW

[w chest pa]
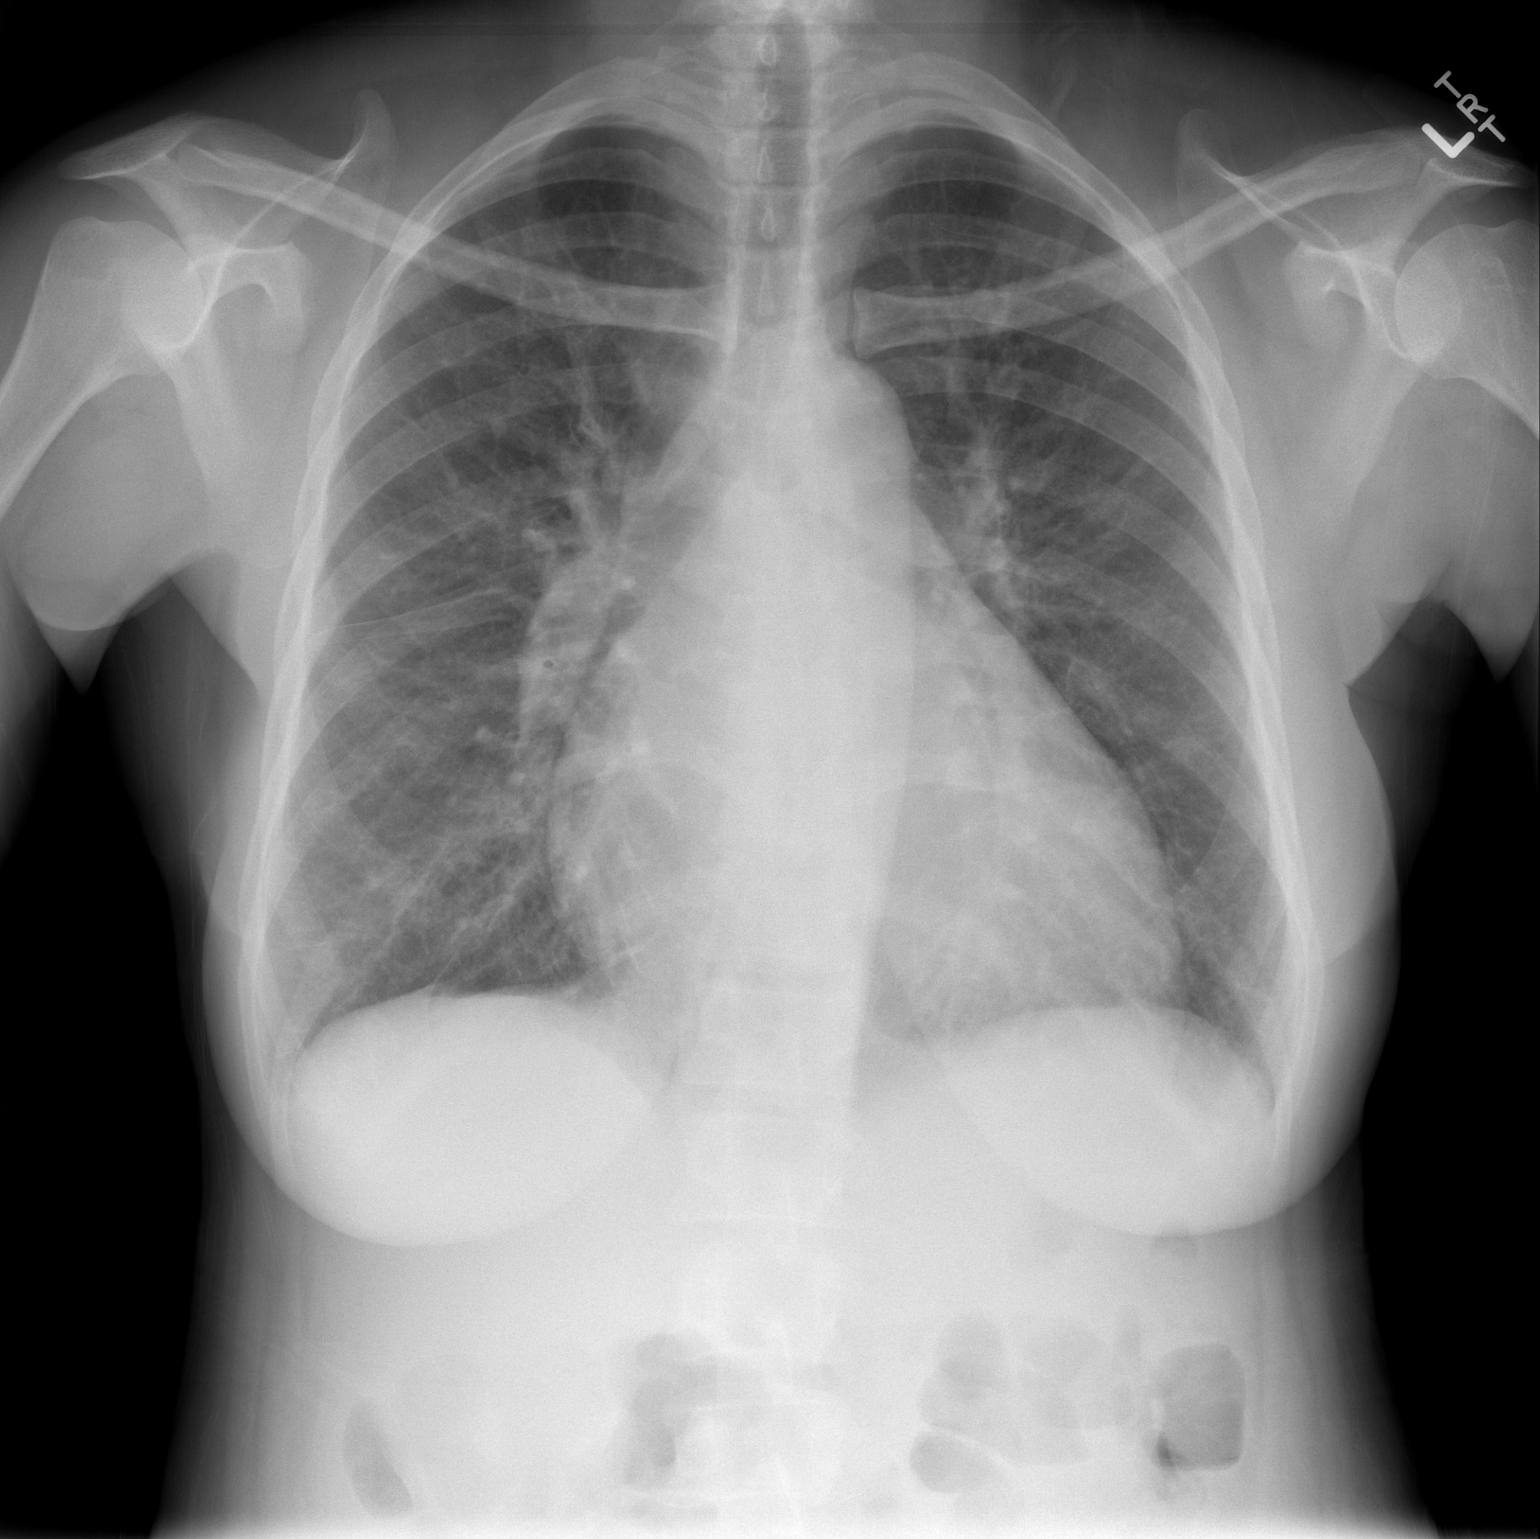

[w chest lat]
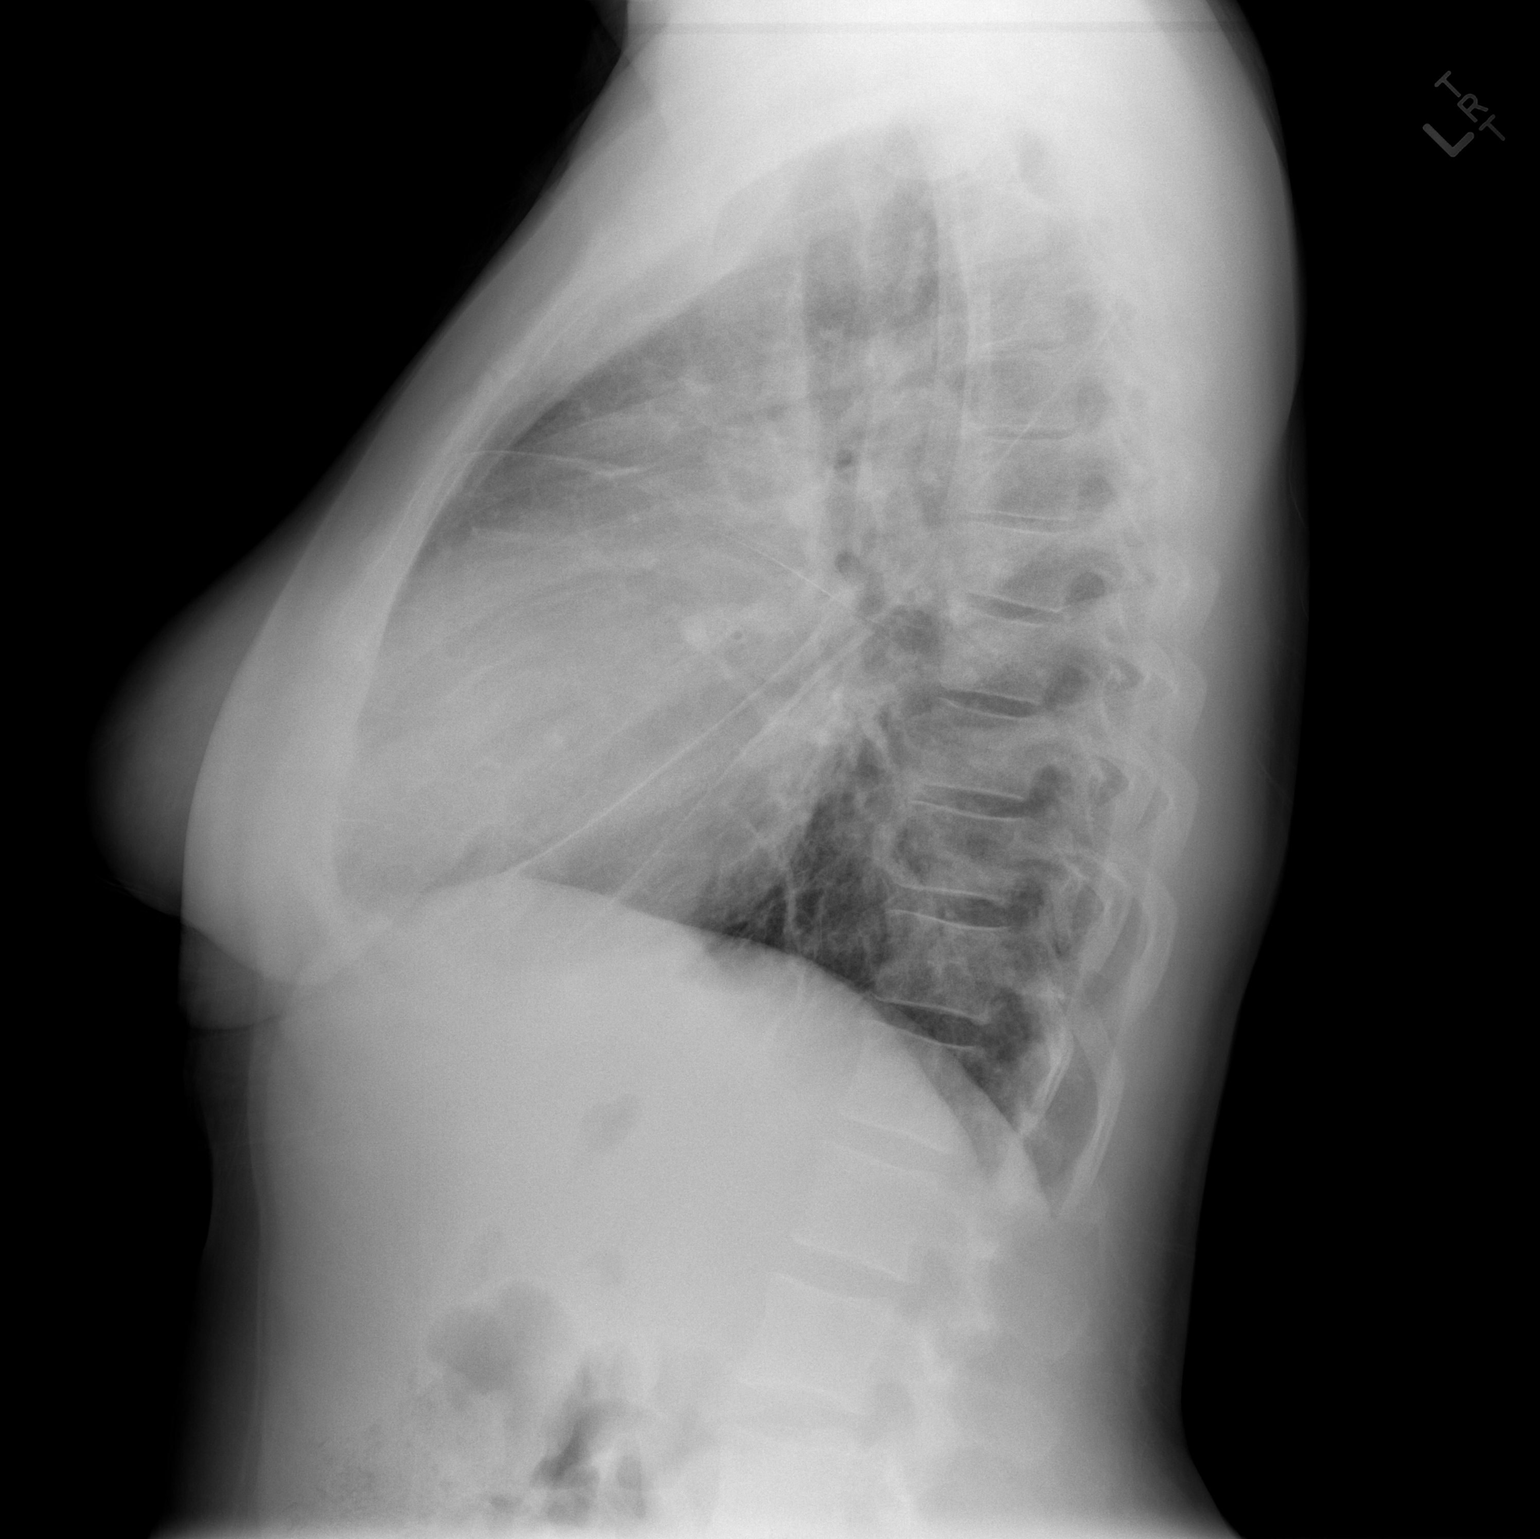

[2 of 2 positions shown; findings below may reference images not displayed]

FINDINGS: Trachea is midline.  Heart is mildly enlarged.  Mild
diffuse interstitial prominence and indistinctness with peripheral
septal lines at the lung bases. Probable trace bilateral pleural
effusions with thickening of the fissures on the lateral view.
IMPRESSION: Mild congestive heart failure.

## 2014-01-31 ENCOUNTER — Telehealth: Payer: Self-pay

## 2014-01-31 NOTE — Telephone Encounter (Signed)
Called patient to remind her to get the flu shot.  She states that she does not get the flu shot.

## 2014-03-09 ENCOUNTER — Other Ambulatory Visit: Payer: Self-pay | Admitting: Family Medicine

## 2014-03-12 ENCOUNTER — Ambulatory Visit: Payer: Self-pay | Admitting: Family Medicine

## 2014-06-08 ENCOUNTER — Other Ambulatory Visit: Payer: Self-pay | Admitting: Family Medicine

## 2014-07-14 ENCOUNTER — Other Ambulatory Visit: Payer: Self-pay | Admitting: Family Medicine

## 2022-04-25 DIAGNOSIS — Z91148 Patient's other noncompliance with medication regimen for other reason: Secondary | ICD-10-CM | POA: Diagnosis not present

## 2022-04-25 DIAGNOSIS — I10 Essential (primary) hypertension: Secondary | ICD-10-CM | POA: Diagnosis not present

## 2022-04-25 DIAGNOSIS — I160 Hypertensive urgency: Secondary | ICD-10-CM | POA: Diagnosis not present

## 2022-04-25 DIAGNOSIS — S93401A Sprain of unspecified ligament of right ankle, initial encounter: Secondary | ICD-10-CM | POA: Diagnosis not present

## 2022-04-25 DIAGNOSIS — R7989 Other specified abnormal findings of blood chemistry: Secondary | ICD-10-CM | POA: Diagnosis not present

## 2022-04-25 DIAGNOSIS — W010XXA Fall on same level from slipping, tripping and stumbling without subsequent striking against object, initial encounter: Secondary | ICD-10-CM | POA: Diagnosis not present

## 2022-04-25 DIAGNOSIS — M25571 Pain in right ankle and joints of right foot: Secondary | ICD-10-CM | POA: Diagnosis not present

## 2022-04-25 DIAGNOSIS — R944 Abnormal results of kidney function studies: Secondary | ICD-10-CM | POA: Diagnosis not present

## 2022-04-25 DIAGNOSIS — X509XXA Other and unspecified overexertion or strenuous movements or postures, initial encounter: Secondary | ICD-10-CM | POA: Diagnosis not present

## 2022-04-25 DIAGNOSIS — M79661 Pain in right lower leg: Secondary | ICD-10-CM | POA: Diagnosis not present

## 2022-04-25 DIAGNOSIS — M85871 Other specified disorders of bone density and structure, right ankle and foot: Secondary | ICD-10-CM | POA: Diagnosis not present

## 2022-04-25 DIAGNOSIS — M7989 Other specified soft tissue disorders: Secondary | ICD-10-CM | POA: Diagnosis not present

## 2022-04-25 DIAGNOSIS — Y92815 Train as the place of occurrence of the external cause: Secondary | ICD-10-CM | POA: Diagnosis not present

## 2022-04-25 DIAGNOSIS — T1490XA Injury, unspecified, initial encounter: Secondary | ICD-10-CM | POA: Diagnosis not present

## 2022-05-03 ENCOUNTER — Encounter (HOSPITAL_COMMUNITY): Payer: Self-pay

## 2022-05-03 ENCOUNTER — Ambulatory Visit (HOSPITAL_COMMUNITY)
Admission: EM | Admit: 2022-05-03 | Discharge: 2022-05-03 | Disposition: A | Discharge status: Home / Self Care | Payer: 59 | Attending: Family Medicine | Admitting: Family Medicine

## 2022-05-03 DIAGNOSIS — M25571 Pain in right ankle and joints of right foot: Secondary | ICD-10-CM | POA: Diagnosis not present

## 2022-05-03 NOTE — ED Triage Notes (Signed)
Pt is here for injury to right ankle x 10 days ago, pt stated she went to [facility] but, they did not have a boot for pt to wear .

## 2022-05-03 NOTE — ED Provider Notes (Signed)
[place]    CSN: [id number] Arrival date & time: 05/03/22  1158      History   Chief Complaint No chief complaint on file.   HPI Sonya Taylor is a 52 y.o. female.   On 3/1 she slipped off of the amtrack, and rolled the right ankle.  She had pain with walking.  She went to the urgent care/ER, xray was negative, just a bad sprain.  They did not have a boot for her.  Given a wrap only.  She continues to have pain and swelling, although the swelling was improved.  She has used ice and elevation, but still with a lot of pain.  Taking tylenol without a lot of help.        Past Medical History:  Diagnosis Date   Hypertension    Iron deficiency anemia    PIH (pregnancy induced hypertension)     Patient Active Problem List   Diagnosis Date Noted   Iron deficiency anemia, unspecified 02/21/2012   Heart murmur, systolic 123456   Menorrhagia, premenopausal 02/21/2012   HTN, goal below 140/90 02/21/2012   CHF with unknown LVEF (mild) 02/21/2012    Past Surgical History:  Procedure Laterality Date   CESAREAN SECTION      OB History   No obstetric history on file.      Home Medications    Prior to Admission medications   Medication Sig Start Date End Date Taking? Authorizing Provider  acetaminophen (TYLENOL) 325 MG tablet Take 650 mg by mouth 2 (two) times daily as needed. For pain   Yes [provider]  amLODipine (NORVASC) 5 MG tablet TAKE 1 TABLET(5 MG) BY MOUTH DAILY 07/15/14  Yes Guest, Benn Moulder, MD  hydrochlorothiazide (MICROZIDE) 12.5 MG capsule Take 1 capsule (12.5 mg total) by mouth daily. 11/20/13  Yes Elby Beck, FNP  vitamin C (ASCORBIC ACID) 500 MG tablet Take 1 tablet (500 mg total) by mouth daily. 02/22/12  Yes Black, Lezlie Octave, NP    Family History Family History  Problem Relation Age of Onset   Stroke Mother     Social History Social History   Tobacco Use   Smoking status: Former  Substance Use Topics    Alcohol use: No   Drug use: No     Allergies   Patient has no known allergies.   Review of Systems Review of Systems  Constitutional: Negative.   HENT: Negative.    Respiratory: Negative.    Gastrointestinal: Negative.   Musculoskeletal:  Positive for arthralgias.     Physical Exam Triage Vital Signs ED Triage Vitals  Enc Vitals Group     BP 05/03/22 1323 (!) 193/116     Pulse Rate 05/03/22 1323 74     Resp 05/03/22 1323 16     Temp 05/03/22 1323 98.6 F (37 C)     Temp Source 05/03/22 1323 Oral     SpO2 05/03/22 1323 96 %     Weight --      Height --      Head Circumference --      Peak Flow --      Pain Score 05/03/22 1320 10     Pain Loc --      Pain Edu? --      Excl. in [place]? --    No data found.  Updated Vital Signs BP (!) 193/116 (BP Location: Left Arm)   Pulse 74   Temp 98.6 F (37 C) (Oral)  Resp 16   SpO2 96%   Visual Acuity Right Eye Distance:   Left Eye Distance:   Bilateral Distance:    Right Eye Near:   Left Eye Near:    Bilateral Near:     Physical Exam Constitutional:      Appearance: Normal appearance.  Musculoskeletal:     Comments: Swelling to the right lateral malleolus;  TTP to the lateral malleolus and proximal foot;  decreased rom due to pain and swelling  Neurological:     General: No focal deficit present.     Mental Status: She is alert.  Psychiatric:        Mood and Affect: Mood normal.      UC Treatments / Results  Labs (all labs ordered are listed, but only abnormal results are displayed) Labs Reviewed - No data to display  EKG   Radiology No results found.  Procedures Procedures (including critical care time)  Medications Ordered in UC Medications - No data to display  Initial Impression / Assessment and Plan / UC Course  I have reviewed the triage vital signs and the nursing notes.  Pertinent labs & imaging results that were available during my care of the patient were reviewed by me and  considered in my medical decision making (see chart for details).   Patient seen today for continued ankle pain.  Upon review of xray there is a questionable fracture.  Patient aware today. Cam boot given today and advised to f/u with orthopedics.   Final Clinical Impressions(s) / UC Diagnoses   Final diagnoses:  Acute right ankle pain     Discharge Instructions      You were seen today for continued pain after ankle injury.  I have given you a boot today for comfort.  I recommend you make an appointment with [place] [facility] at [phone].  [place], [place]    ED Prescriptions   None    PDMP not reviewed this encounter.   Rondel Oh, MD 05/03/22 1351

## 2022-05-03 NOTE — Discharge Instructions (Signed)
You were seen today for continued pain after ankle injury.  I have given you a boot today for comfort.  I recommend you make an appointment with [facility] at [phone].  [place], [place]

## 2022-05-07 DIAGNOSIS — M25571 Pain in right ankle and joints of right foot: Secondary | ICD-10-CM | POA: Diagnosis not present

## 2022-05-19 DIAGNOSIS — M25571 Pain in right ankle and joints of right foot: Secondary | ICD-10-CM | POA: Diagnosis not present

## 2022-05-24 DIAGNOSIS — S92001A Unspecified fracture of right calcaneus, initial encounter for closed fracture: Secondary | ICD-10-CM | POA: Diagnosis not present

## 2022-06-21 DIAGNOSIS — S92001A Unspecified fracture of right calcaneus, initial encounter for closed fracture: Secondary | ICD-10-CM | POA: Diagnosis not present

## 2022-07-21 DIAGNOSIS — S92001A Unspecified fracture of right calcaneus, initial encounter for closed fracture: Secondary | ICD-10-CM | POA: Diagnosis not present

## 2022-08-30 DIAGNOSIS — M79671 Pain in right foot: Secondary | ICD-10-CM | POA: Diagnosis not present

## 2022-08-30 DIAGNOSIS — M25571 Pain in right ankle and joints of right foot: Secondary | ICD-10-CM | POA: Diagnosis not present

## 2022-12-30 DIAGNOSIS — I160 Hypertensive urgency: Secondary | ICD-10-CM | POA: Diagnosis not present

## 2022-12-30 DIAGNOSIS — I517 Cardiomegaly: Secondary | ICD-10-CM | POA: Diagnosis not present

## 2022-12-30 DIAGNOSIS — K130 Diseases of lips: Secondary | ICD-10-CM | POA: Diagnosis not present

## 2022-12-30 DIAGNOSIS — I10 Essential (primary) hypertension: Secondary | ICD-10-CM | POA: Diagnosis not present

## 2022-12-30 DIAGNOSIS — R030 Elevated blood-pressure reading, without diagnosis of hypertension: Secondary | ICD-10-CM | POA: Diagnosis not present

## 2023-01-11 DIAGNOSIS — I10 Essential (primary) hypertension: Secondary | ICD-10-CM | POA: Diagnosis not present

## 2023-01-11 DIAGNOSIS — R9431 Abnormal electrocardiogram [ECG] [EKG]: Secondary | ICD-10-CM | POA: Diagnosis not present

## 2023-01-12 DIAGNOSIS — L538 Other specified erythematous conditions: Secondary | ICD-10-CM | POA: Diagnosis not present

## 2023-01-12 DIAGNOSIS — D492 Neoplasm of unspecified behavior of bone, soft tissue, and skin: Secondary | ICD-10-CM | POA: Diagnosis not present
